# Patient Record
Sex: Male | Born: 1950 | Race: White | Hispanic: No | Marital: Single
Health system: Midwestern US, Academic
[De-identification: ages and names within clinical notes are randomized; demographics above are authoritative.]

---

## 2017-01-16 ENCOUNTER — Encounter: Admit: 2017-01-16 | Discharge: 2017-01-16 | Payer: Medicare Other

## 2017-01-16 DIAGNOSIS — F3340 Major depressive disorder, recurrent, in remission, unspecified: Principal | ICD-10-CM

## 2017-01-16 NOTE — Telephone Encounter
Last seen 12/01/16. Last filled 08/09/16 for sertraline and 12/20/13 for advair

## 2017-01-17 MED ORDER — SERTRALINE 50 MG PO TAB
ORAL_TABLET | Freq: Every day | 0 refills | Status: AC
Start: 2017-01-17 — End: 2017-02-14

## 2017-01-17 MED ORDER — FLUTICASONE-SALMETEROL 250-50 MCG/DOSE IN DSDV
1 | Freq: Two times a day (BID) | RESPIRATORY_TRACT | 1 refills | Status: AC
Start: 2017-01-17 — End: 2017-09-16

## 2017-01-20 ENCOUNTER — Encounter: Admit: 2017-01-20 | Discharge: 2017-01-20 | Payer: Medicare Other

## 2017-01-20 DIAGNOSIS — J410 Simple chronic bronchitis: Principal | ICD-10-CM

## 2017-01-20 NOTE — Telephone Encounter
He has been on these medications for over three years, [facility], see if he is still going to pulmonary.

## 2017-01-20 NOTE — Telephone Encounter
[facility] called with interaction between his HIV med with the Advair, saying the HIV med increases the advair side effects. . Do you want to change something, or go to lower Advair dose?

## 2017-01-21 NOTE — Telephone Encounter
L/m with pt to call me. L/m with pharm tech to let him know to call here too.

## 2017-02-14 ENCOUNTER — Encounter: Admit: 2017-02-14 | Discharge: 2017-02-14 | Payer: Medicare Other

## 2017-02-14 DIAGNOSIS — F3340 Major depressive disorder, recurrent, in remission, unspecified: Principal | ICD-10-CM

## 2017-02-14 MED ORDER — SERTRALINE 50 MG PO TAB
ORAL_TABLET | Freq: Every day | 0 refills | Status: AC
Start: 2017-02-14 — End: 2017-03-21

## 2017-02-14 NOTE — Telephone Encounter
Last Office visit 12/04/16

## 2017-03-21 ENCOUNTER — Encounter: Admit: 2017-03-21 | Discharge: 2017-03-21 | Payer: Medicare Other

## 2017-03-21 DIAGNOSIS — F3340 Major depressive disorder, recurrent, in remission, unspecified: Principal | ICD-10-CM

## 2017-03-21 MED ORDER — SERTRALINE 50 MG PO TAB
ORAL_TABLET | Freq: Every day | 0 refills | Status: AC
Start: 2017-03-21 — End: 2017-04-18

## 2017-03-21 NOTE — Telephone Encounter
Last Office visit 12/04/16

## 2017-04-18 ENCOUNTER — Encounter: Admit: 2017-04-18 | Discharge: 2017-04-18 | Payer: Medicare Other

## 2017-04-18 DIAGNOSIS — F3340 Major depressive disorder, recurrent, in remission, unspecified: Principal | ICD-10-CM

## 2017-04-18 MED ORDER — SERTRALINE 50 MG PO TAB
ORAL_TABLET | Freq: Every day | 0 refills | Status: AC
Start: 2017-04-18 — End: 2017-05-20

## 2017-04-18 NOTE — Telephone Encounter
Last Office visit 12/04/16

## 2017-05-19 ENCOUNTER — Encounter: Admit: 2017-05-19 | Discharge: 2017-05-19 | Payer: Medicare Other

## 2017-05-19 DIAGNOSIS — F3340 Major depressive disorder, recurrent, in remission, unspecified: Principal | ICD-10-CM

## 2017-05-20 MED ORDER — SERTRALINE 50 MG PO TAB
ORAL_TABLET | Freq: Every day | 0 refills | Status: AC
Start: 2017-05-20 — End: 2017-06-18

## 2017-05-20 NOTE — Telephone Encounter
Last Office visit 12/04/16  Pt has no upcoming apt scheduled at this time

## 2017-06-18 ENCOUNTER — Encounter: Admit: 2017-06-18 | Discharge: 2017-06-18 | Payer: Medicare Other

## 2017-06-18 DIAGNOSIS — F3340 Major depressive disorder, recurrent, in remission, unspecified: Principal | ICD-10-CM

## 2017-06-18 MED ORDER — SERTRALINE 50 MG PO TAB
ORAL_TABLET | Freq: Every day | 0 refills | Status: AC
Start: 2017-06-18 — End: 2017-07-18

## 2017-06-18 NOTE — Telephone Encounter
Last Office visit 12/04/16

## 2017-07-17 ENCOUNTER — Encounter: Admit: 2017-07-17 | Discharge: 2017-07-17 | Payer: Medicare Other

## 2017-07-17 DIAGNOSIS — F3340 Major depressive disorder, recurrent, in remission, unspecified: Principal | ICD-10-CM

## 2017-07-18 MED ORDER — SERTRALINE 50 MG PO TAB
ORAL_TABLET | Freq: Every day | 0 refills | Status: AC
Start: 2017-07-18 — End: 2017-08-19

## 2017-07-18 NOTE — Telephone Encounter
Last Office visit 12/04/16

## 2017-08-18 ENCOUNTER — Encounter: Admit: 2017-08-18 | Discharge: 2017-08-18 | Payer: Medicare Other

## 2017-08-18 DIAGNOSIS — F3340 Major depressive disorder, recurrent, in remission, unspecified: Principal | ICD-10-CM

## 2017-08-18 MED ORDER — SERTRALINE 50 MG PO TAB
ORAL_TABLET | Freq: Every day | 0 refills | Status: AC
Start: 2017-08-18 — End: 2017-08-19

## 2017-08-19 MED ORDER — SERTRALINE 50 MG PO TAB
100 mg | ORAL_TABLET | Freq: Every day | ORAL | 0 refills | Status: AC
Start: 2017-08-19 — End: 2017-09-16

## 2017-09-16 ENCOUNTER — Encounter: Admit: 2017-09-16 | Discharge: 2017-09-16 | Payer: Medicare Other

## 2017-09-16 DIAGNOSIS — F3340 Major depressive disorder, recurrent, in remission, unspecified: Principal | ICD-10-CM

## 2017-09-16 MED ORDER — ADVAIR DISKUS 250-50 MCG/DOSE IN DSDV
Freq: Two times a day (BID) | 1 refills | Status: AC
Start: 2017-09-16 — End: 2017-11-21

## 2017-09-16 MED ORDER — SERTRALINE 50 MG PO TAB
ORAL_TABLET | Freq: Every day | 0 refills | Status: AC
Start: 2017-09-16 — End: 2017-10-14

## 2017-10-14 ENCOUNTER — Encounter: Admit: 2017-10-14 | Discharge: 2017-10-14 | Payer: Medicare Other

## 2017-10-14 DIAGNOSIS — F3340 Major depressive disorder, recurrent, in remission, unspecified: Principal | ICD-10-CM

## 2017-10-14 MED ORDER — SERTRALINE 50 MG PO TAB
ORAL_TABLET | Freq: Every day | 0 refills | Status: AC
Start: 2017-10-14 — End: 2017-11-21

## 2017-11-04 ENCOUNTER — Encounter: Admit: 2017-11-04 | Discharge: 2017-11-04 | Payer: Medicare Other

## 2017-11-04 ENCOUNTER — Ambulatory Visit: Admit: 2017-11-04 | Discharge: 2017-11-05 | Payer: MEDICARE

## 2017-11-04 DIAGNOSIS — B20 Human immunodeficiency virus [HIV] disease: Principal | ICD-10-CM

## 2017-11-04 DIAGNOSIS — J410 Simple chronic bronchitis: ICD-10-CM

## 2017-11-04 DIAGNOSIS — G609 Hereditary and idiopathic neuropathy, unspecified: ICD-10-CM

## 2017-11-04 DIAGNOSIS — Z125 Encounter for screening for malignant neoplasm of prostate: ICD-10-CM

## 2017-11-04 DIAGNOSIS — I10 Essential (primary) hypertension: ICD-10-CM

## 2017-11-04 DIAGNOSIS — Z23 Encounter for immunization: ICD-10-CM

## 2017-11-04 DIAGNOSIS — F329 Major depressive disorder, single episode, unspecified: ICD-10-CM

## 2017-11-04 DIAGNOSIS — Z0000 Encounter for general adult medical examination without abnormal findings: Principal | ICD-10-CM

## 2017-11-04 DIAGNOSIS — F3340 Major depressive disorder, recurrent, in remission, unspecified: ICD-10-CM

## 2017-11-04 DIAGNOSIS — Z1329 Encounter for screening for other suspected endocrine disorder: ICD-10-CM

## 2017-11-04 DIAGNOSIS — R350 Frequency of micturition: ICD-10-CM

## 2017-11-04 DIAGNOSIS — Z136 Encounter for screening for cardiovascular disorders: ICD-10-CM

## 2017-11-05 ENCOUNTER — Encounter: Admit: 2017-11-05 | Discharge: 2017-11-05 | Payer: Medicare Other

## 2017-11-05 DIAGNOSIS — R5382 Chronic fatigue, unspecified: ICD-10-CM

## 2017-11-05 DIAGNOSIS — R718 Other abnormality of red blood cells: Principal | ICD-10-CM

## 2017-11-10 ENCOUNTER — Encounter: Admit: 2017-11-10 | Discharge: 2017-11-10 | Payer: Medicare Other

## 2017-11-10 DIAGNOSIS — R82998 Other abnormal findings in urine: Principal | ICD-10-CM

## 2017-11-11 LAB — LIPID PROFILE: Lab: 175 mg/dL (ref <200)

## 2017-11-11 LAB — CBC AND DIFF: Lab: 9.9 10*3/uL — ABNORMAL HIGH (ref <150)

## 2017-11-11 LAB — COMPREHENSIVE METABOLIC PANEL: Lab: 95 mg/dL (ref >40)

## 2017-11-11 LAB — PSA SCREEN

## 2017-11-11 LAB — VITAMIN B12: Lab: 495 pg/mL (ref 200–1100)

## 2017-11-11 LAB — URINALYSIS, MICROSCOPIC

## 2017-11-11 LAB — TSH WITH FREE T4 REFLEX: Lab: 2 m[IU]/L — ABNORMAL LOW (ref <=5)

## 2017-11-11 LAB — FOLATE, SERUM: Lab: 17 ng/mL

## 2017-11-17 ENCOUNTER — Encounter: Admit: 2017-11-17 | Discharge: 2017-11-17 | Payer: Medicare Other

## 2017-11-17 DIAGNOSIS — Z136 Encounter for screening for cardiovascular disorders: Principal | ICD-10-CM

## 2017-11-20 ENCOUNTER — Encounter: Admit: 2017-11-20 | Discharge: 2017-11-20 | Payer: Medicare Other

## 2017-11-20 DIAGNOSIS — F3340 Major depressive disorder, recurrent, in remission, unspecified: Principal | ICD-10-CM

## 2017-11-21 MED ORDER — SERTRALINE 50 MG PO TAB
ORAL_TABLET | Freq: Every day | 0 refills | Status: AC
Start: 2017-11-21 — End: 2017-12-22

## 2017-11-21 MED ORDER — WIXELA INHUB 250-50 MCG/DOSE IN DSDV
Freq: Two times a day (BID) | 3 refills | Status: AC
Start: 2017-11-21 — End: 2018-04-01

## 2017-12-22 ENCOUNTER — Encounter: Admit: 2017-12-22 | Discharge: 2017-12-22 | Payer: Medicare Other

## 2017-12-22 DIAGNOSIS — F3340 Major depressive disorder, recurrent, in remission, unspecified: Principal | ICD-10-CM

## 2017-12-22 MED ORDER — SERTRALINE 50 MG PO TAB
ORAL_TABLET | Freq: Every day | 0 refills | Status: AC
Start: 2017-12-22 — End: 2018-01-20

## 2018-01-09 ENCOUNTER — Encounter: Admit: 2018-01-09 | Discharge: 2018-01-09 | Payer: Medicare Other

## 2018-01-19 ENCOUNTER — Encounter: Admit: 2018-01-19 | Discharge: 2018-01-19 | Payer: Medicare Other

## 2018-01-19 DIAGNOSIS — F3340 Major depressive disorder, recurrent, in remission, unspecified: Principal | ICD-10-CM

## 2018-01-20 MED ORDER — SERTRALINE 50 MG PO TAB
ORAL_TABLET | Freq: Every day | 0 refills | Status: AC
Start: 2018-01-20 — End: 2018-02-27

## 2018-02-13 ENCOUNTER — Encounter: Admit: 2018-02-13 | Discharge: 2018-02-13 | Payer: Medicare Other

## 2018-02-27 ENCOUNTER — Encounter: Admit: 2018-02-27 | Discharge: 2018-02-27 | Payer: Medicare Other

## 2018-02-27 DIAGNOSIS — F3340 Major depressive disorder, recurrent, in remission, unspecified: Principal | ICD-10-CM

## 2018-02-27 MED ORDER — SERTRALINE 50 MG PO TAB
ORAL_TABLET | Freq: Every day | 0 refills | Status: AC
Start: 2018-02-27 — End: 2018-04-01

## 2018-04-01 ENCOUNTER — Encounter: Admit: 2018-04-01 | Discharge: 2018-04-01 | Payer: Medicare Other

## 2018-04-01 DIAGNOSIS — F3340 Major depressive disorder, recurrent, in remission, unspecified: Principal | ICD-10-CM

## 2018-04-01 MED ORDER — WIXELA INHUB 250-50 MCG/DOSE IN DSDV
Freq: Two times a day (BID) | 0 refills | Status: AC
Start: 2018-04-01 — End: 2018-06-04

## 2018-04-01 MED ORDER — SERTRALINE 50 MG PO TAB
ORAL_TABLET | Freq: Every day | 0 refills | Status: AC
Start: 2018-04-01 — End: 2018-11-04

## 2018-06-03 ENCOUNTER — Encounter: Admit: 2018-06-03 | Discharge: 2018-06-03 | Payer: Medicare Other

## 2018-06-04 MED ORDER — WIXELA INHUB 250-50 MCG/DOSE IN DSDV
Freq: Two times a day (BID) | 0 refills | Status: AC
Start: 2018-06-04 — End: 2019-01-06

## 2018-06-10 MED ORDER — ALBUTEROL SULFATE 90 MCG/ACTUATION IN HFAA
2 | RESPIRATORY_TRACT | 3 refills | Status: AC | PRN
Start: 2018-06-10 — End: 2019-01-29

## 2018-06-17 ENCOUNTER — Encounter: Admit: 2018-06-17 | Discharge: 2018-06-17 | Payer: Medicare Other

## 2018-06-19 ENCOUNTER — Encounter: Admit: 2018-06-19 | Discharge: 2018-06-19 | Payer: Medicare Other

## 2018-06-19 ENCOUNTER — Ambulatory Visit: Admit: 2018-06-19 | Discharge: 2018-06-20 | Payer: MEDICARE

## 2018-06-19 DIAGNOSIS — F329 Major depressive disorder, single episode, unspecified: ICD-10-CM

## 2018-06-19 DIAGNOSIS — I10 Essential (primary) hypertension: ICD-10-CM

## 2018-06-19 DIAGNOSIS — G609 Hereditary and idiopathic neuropathy, unspecified: ICD-10-CM

## 2018-06-19 DIAGNOSIS — B20 Human immunodeficiency virus [HIV] disease: Principal | ICD-10-CM

## 2018-06-19 MED ORDER — NICOTINE 21 MG/24 HR TD PT24
1 | MEDICATED_PATCH | TRANSDERMAL | 0 refills | Status: AC
Start: 2018-06-19 — End: ?

## 2018-06-20 DIAGNOSIS — R350 Frequency of micturition: Principal | ICD-10-CM

## 2018-06-20 DIAGNOSIS — Z23 Encounter for immunization: ICD-10-CM

## 2018-06-20 DIAGNOSIS — F3340 Major depressive disorder, recurrent, in remission, unspecified: Secondary | ICD-10-CM

## 2018-06-20 LAB — URINALYSIS, MICROSCOPIC

## 2018-06-21 LAB — CULTURE-URINE W/SENSITIVITY

## 2018-06-22 ENCOUNTER — Encounter: Admit: 2018-06-22 | Discharge: 2018-06-22 | Payer: Medicare Other

## 2018-06-25 ENCOUNTER — Encounter: Admit: 2018-06-25 | Discharge: 2018-06-25 | Payer: Medicare Other

## 2018-07-08 ENCOUNTER — Ambulatory Visit: Admit: 2018-07-08 | Discharge: 2018-07-09 | Payer: MEDICARE

## 2018-07-08 ENCOUNTER — Encounter: Admit: 2018-07-08 | Discharge: 2018-07-08 | Payer: Medicare Other

## 2018-07-08 DIAGNOSIS — B20 Human immunodeficiency virus [HIV] disease: Principal | ICD-10-CM

## 2018-07-08 DIAGNOSIS — G609 Hereditary and idiopathic neuropathy, unspecified: ICD-10-CM

## 2018-07-08 DIAGNOSIS — F329 Major depressive disorder, single episode, unspecified: ICD-10-CM

## 2018-07-08 DIAGNOSIS — I10 Essential (primary) hypertension: ICD-10-CM

## 2018-07-08 MED ORDER — DOXYCYCLINE MONOHYDRATE 100 MG PO TAB
100 mg | ORAL_TABLET | Freq: Two times a day (BID) | ORAL | 0 refills | 8.00000 days | Status: AC
Start: 2018-07-08 — End: 2019-01-12

## 2018-07-09 DIAGNOSIS — J069 Acute upper respiratory infection, unspecified: Principal | ICD-10-CM

## 2018-11-04 ENCOUNTER — Encounter: Admit: 2018-11-04 | Discharge: 2018-11-04 | Payer: Medicare Other

## 2018-11-04 DIAGNOSIS — F3340 Major depressive disorder, recurrent, in remission, unspecified: Principal | ICD-10-CM

## 2018-11-04 MED ORDER — SERTRALINE 50 MG PO TAB
ORAL_TABLET | Freq: Every day | 0 refills | Status: AC
Start: 2018-11-04 — End: 2018-11-30

## 2018-11-30 ENCOUNTER — Encounter: Admit: 2018-11-30 | Discharge: 2018-11-30 | Payer: Medicare Other

## 2018-11-30 DIAGNOSIS — F3340 Major depressive disorder, recurrent, in remission, unspecified: Principal | ICD-10-CM

## 2018-11-30 MED ORDER — SERTRALINE 50 MG PO TAB
ORAL_TABLET | Freq: Every day | 0 refills | Status: DC
Start: 2018-11-30 — End: 2018-12-28

## 2018-12-15 ENCOUNTER — Encounter: Admit: 2018-12-15 | Discharge: 2018-12-15 | Payer: Medicare Other

## 2018-12-22 ENCOUNTER — Encounter: Admit: 2018-12-22 | Discharge: 2018-12-22 | Payer: Medicare Other

## 2018-12-22 NOTE — Telephone Encounter
Patient called with concerns related to COVID.    Patient experiencing any life-threatening symptoms? No  Extreme difficulty breathing, blue lips/face, severe/constant pain or pressure in the chest, altered mental status, slurred speech, seizure, coughing up blood, too weak to stand, etc.  - If yes, 911 or ED recommended.     Known or suspected contact with COVID positive case? No    Patient in long term care, skilled nursing, etc.? No    COVID symptoms? Yes, Fever, Shortness of breath (mild), Fatigue, Anorexia (decreased hunger) and Headache. Symptoms started on 12/15/2018.    Please select any risk factors: Patient 60+    Assessment: COVID suspected.    Plan: Transferred patient to scheduling line to schedule swab.    If patient is a suspected case, please direct them to their county health department to self-report symptoms.  [facility]: [email or web address]  [facility]: [email or web address]     Testing Guidelines    CDC Testing Guidelines    Script for suspected cases, NOT getting tested:  Sorry you're feeling ill. You have one or more symptom(s) that may be related to COVID-19. Start home isolation. This means staying home except to get medical care. Do not go to work, school, or public areas. Do not use public transportation or ride sharing. Follow good hand hygiene and increase fluids and rest. Please call back or seek care if you feel worse or you think it is an emergency. Call ahead before going to any ED or clinic.   You can stop isolation if:  1. You have had no fever for at least 72 hours (that is three full days of no fever without the use medicine that reduces fevers)  AND  2. Other symptoms have improved (for example, when your cough or shortness of breath have improved)  AND   3. At least 7 days have passed since your symptoms first appeared

## 2018-12-28 ENCOUNTER — Encounter: Admit: 2018-12-28 | Discharge: 2018-12-28 | Payer: Medicare Other

## 2018-12-28 DIAGNOSIS — F3340 Major depressive disorder, recurrent, in remission, unspecified: Principal | ICD-10-CM

## 2018-12-28 MED ORDER — SERTRALINE 50 MG PO TAB
ORAL_TABLET | Freq: Every day | 0 refills | Status: DC
Start: 2018-12-28 — End: 2019-01-29

## 2019-01-05 ENCOUNTER — Encounter: Admit: 2019-01-05 | Discharge: 2019-01-05 | Payer: Medicare Other

## 2019-01-06 MED ORDER — ADVAIR DISKUS 250-50 MCG/DOSE IN DSDV
Freq: Two times a day (BID) | 1 refills | Status: DC
Start: 2019-01-06 — End: 2019-03-02

## 2019-01-11 ENCOUNTER — Encounter: Admit: 2019-01-11 | Discharge: 2019-01-11

## 2019-01-11 NOTE — Telephone Encounter
Patient left message on triage line requesting follow up appointment after ED visit.    Called patient back and left message to return call.

## 2019-01-12 ENCOUNTER — Encounter: Admit: 2019-01-12 | Discharge: 2019-01-12

## 2019-01-12 DIAGNOSIS — J410 Simple chronic bronchitis: Secondary | ICD-10-CM

## 2019-01-12 DIAGNOSIS — I10 Essential (primary) hypertension: Secondary | ICD-10-CM

## 2019-01-12 DIAGNOSIS — F329 Major depressive disorder, single episode, unspecified: Secondary | ICD-10-CM

## 2019-01-12 DIAGNOSIS — B20 Human immunodeficiency virus [HIV] disease: Secondary | ICD-10-CM

## 2019-01-12 DIAGNOSIS — G609 Hereditary and idiopathic neuropathy, unspecified: Secondary | ICD-10-CM

## 2019-01-12 DIAGNOSIS — F17210 Nicotine dependence, cigarettes, uncomplicated: Secondary | ICD-10-CM

## 2019-01-12 NOTE — Patient Instructions
Smoking Cessation Patient Educational resources:   Dear patient,   As your healthcare team, we want you to take steps to cut down or quit smoking. Please review the following educational materials and resources:  Resources for Patients to Quit Smoking  If you live outside of [place] or [place]  National Quit line: 1-800-QUIT-NOW (1-800-784-8669) This line will route you to your state sponsored quit line based on your calling area.  The website for the national quit line is: www.smokefree.gov  For [place] residents:   [place] Dept of Health & Environment KanQuit program  (1-800-QUIT-NOW or 1-800-784-8669)  http://www.kdheks.gov/tobacco/cessation.html  Free program 24hrs per day 7 days per week. English, Spanish, & 150 other languages.  [place] Quit line: 1-866-KAN-STOP (1-866-526-7867)  http://www.kstask.org/quitline.html  Free program available 24 hrs per day 7 days per week. Information is available via phone or online. Phone assessment by trained counselor to help identify triggers and set goals, smoker can call as many times as desired.  For [place] residents  [place] quit line website:  http://health.mo.gov/living/wellness/tobacco/smokingandtobacco/  link directly to the [place] quit [place] patient brochure:  http://health.mo.gov/living/wellness/tobacco/smokingandtobacco/pdf/QuitlineFactSheet2.pdf  ==================================================================

## 2019-01-12 NOTE — Telephone Encounter
ED Discharge Follow Up  Reached patient: Seen in clinic 24-48 hours post discharge  Admission Information  Hospital Name : [facility]   ED Admission Date: 01/10/19 ED Discharge Date: 01/10/19 Admission Diagnosis: Shortness of breath / cough  Discharge Diagnosis COPD exacerbation (HCC) (Primary Dx)  Hospital Services: Unplanned  Today's call is 2 (calendar) days post discharge    Scheduling Follow-up Appointment  Upcoming appointment date and time and with whom scheduled:   Future Appointments   Date Time Provider Department Center   03/01/2019  2:30 PM Primus Bravo, DO [facility]     When was patients last PCP visit: 01/12/2019  PCP primary location: [facility]  PCP appointment scheduled? Yes, Date: seen 01/12/2019   Specialist appointment scheduled? No  Both PCP and Specialist appointment scheduled: No  Is assistance with transportation needed? No    ED Communication    Did Pt call Clinic prior to going to ED? No      Lavell Islam, [place]

## 2019-01-12 NOTE — Progress Notes
Date of Service: 01/12/2019    Anthony Whitehead is a 68 y.o. male.  DOB: June 06, 1951  MRN: [id number]     Subjective:             History of Present Illness   This patient is here today stating that he was in the emergency room on Sunday at [facility] for cough and shortness of breath.  He did have a negative COVID-19 test performed.  He was given a Z-Pak and prednisone and is doing much better at this point.  However the patient continues to smoke cigarettes although he is trying to decrease his amount and we discussed different strategies to quit smoking.  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         Review of Systems   Constitutional: Negative.    HENT: Negative.    Eyes: Negative.    Respiratory: Positive for cough.    Cardiovascular: Negative.    Gastrointestinal: Negative.    Endocrine: Negative.    Genitourinary: Negative.    Musculoskeletal: Negative.    Skin: Negative.    Allergic/Immunologic: Negative.    Neurological: Negative.    Hematological: Negative.    Psychiatric/Behavioral: Negative.          Objective:         ??? ADVAIR DISKUS 250-50 mcg/dose inhalation disk INHALE 1 PUFF BY MOUTH EVERY 12 HOURS   ??? albuterol (PROAIR HFA, VENTOLIN HFA, PROVENTIL HFA) 90 mcg/actuation inhaler Inhale two puffs by mouth into the lungs every 6 hours as needed.   ??? azithromycin (ZITHROMAX) 250 mg tablet Take 2 tablets (500 mg total) on day 1.  Take 1 tablet (250 mg total) by mouth daily from day 2-4.   ??? bictegravir-emtricitabine-tenofovir ala (BIKTARVY) 50-200-25 mg tablet Take 1 tablet by mouth daily.   ??? cyanocobalamin(+) (VITAMIN B-12) 100 mcg tablet Take 100 mcg by mouth daily.   ??? doxycycline (ADOXA) 100 mg tablet Take one tablet by mouth twice daily.   ??? MULTIVITAMIN PO Take  by mouth.     ??? nicotine (NICODERM CQ STEP 1) 21 mg/day patch Apply one patch to top of skin as directed every 24 hours. Rotate patch location.  Indications: Stop Smoking   ??? predniSONE (DELTASONE) 20 mg tablet Take 40 mg by mouth daily.   ??? sertraline (ZOLOFT) 50 mg tablet TAKE 2 TABLETS BY MOUTH DAILY     Vitals:    01/12/19 1334   BP: (!) 140/70   BP Source: Arm, Left Upper   Patient Position: Sitting   Pulse: 112   Resp: 16   Temp: 36.5 ???C (97.7 ???F)   TempSrc: Oral   SpO2: 97%   Weight: 70.8 kg (156 lb)   Height: 185.4 cm (73)   PainSc: Zero     Body mass index is 20.58 kg/m???.     Physical Exam  Constitutional:       Appearance: He is well-developed.   HENT:      Head: Normocephalic.      Right Ear: External ear normal.      Left Ear: External ear normal.   Eyes:      Pupils: Pupils are equal, round, and reactive to light.   Neck:      Musculoskeletal: Normal range of motion and neck supple.   Cardiovascular:      Rate and  Rhythm: Normal rate and regular rhythm.      Heart sounds: Normal heart sounds.   Pulmonary:      Effort: Pulmonary effort is normal.      Breath sounds: Normal breath sounds.   Abdominal:      General: Bowel sounds are normal.      Palpations: Abdomen is soft.   Musculoskeletal: Normal range of motion.   Skin:     General: Skin is warm and dry.   Neurological:      Mental Status: He is alert and oriented to person, place, and time.      Deep Tendon Reflexes: Reflexes are normal and symmetric.              Assessment and Plan:  Anthony Burget. Whitehead was seen today for follow up.    Diagnoses and all orders for this visit:    Simple chronic bronchitis (HCC)    HIV infection, unspecified symptom status (HCC)      Smoking Cessation Management & Counseling:  Social History     Tobacco Use   ??? Smoking status: Current Every Day Smoker     Packs/day: 0.50     Start date: 11/12/2018   ??? Smokeless tobacco: Never Used   Substance Use Topics   ??? Alcohol use: Yes     Frequency: Monthly or less     Drinks per session: 1 or 2     Binge frequency: Never During today's visit, I counseled the patient on smoking cessation.  This was through face-to-face patient contact.  We discussed how tobacco use can adversely affect the patient's medical condition. We also discussed strategies to quit smoking including the use of medications and counseling.  The patient is planning to try to quit or cut down on smoking.   Impression: Tobacco use (smoking)  Plan:  The following are brief descriptors of advice discussed at today's visit:   Practical Counseling Advice (problem -solving/skills training)  ??? Make the house smoke free                  ??? Identify alternatives when faced with the urge to smoke       .     Identified reasons to quit   Recommended patient consider speaking to a cessation counselor over the telephone or signing up for a text-message cessation program. The patient chose:free smoking cessation quitline (1-800-QUIT-NOW 917-217-0590).  Discussed medication options (covered by most insurance plans). The patient chose:No medications at this time.  Time spent during clinic visit with patient discussing and counseling regarding smoking cessation is:3-10 minutes.  Smoking Cessation Patient Educational resources:   Dear patient,   As your healthcare team, we want you to take steps to cut down or quit smoking. Please review the following educational materials and resources:  Resources for Patients to Quit Smoking  If you live outside of [place] or [place] Quit line: 1-800-QUIT-NOW 7044583499) This line will route you to your state sponsored quit line based on your calling area.  The website for the national quit line is: www.smokefree.gov  For [place] residents:   [place] Dept of Health & Environment KanQuit program  (952)876-5249 or (509) 708-3245)  ConnectRV.is.html  Free program 24hrs per day 7 days per week. English, [place], & 150 other languages.  [place] Quit line: 1-866-KAN-STOP (3-244-010-2725) http://www.kstask.org/quitline.html  Free program available 24 hrs per day 7 days per week. Information is available via phone or online. Phone assessment by trained counselor to help identify  triggers and set goals, smoker can call as many times as desired.  For [place] residents  [place] website:  ThisMLS.nl  link directly to the [place] quit [place] patient brochure:  TVFolio.ch.pdf  ==================================================================

## 2019-01-13 ENCOUNTER — Ambulatory Visit: Admit: 2019-01-12 | Discharge: 2019-01-13

## 2019-01-13 DIAGNOSIS — B20 Human immunodeficiency virus [HIV] disease: Secondary | ICD-10-CM

## 2019-01-19 ENCOUNTER — Encounter: Admit: 2019-01-19 | Discharge: 2019-01-19

## 2019-01-19 NOTE — Telephone Encounter
OK 

## 2019-01-19 NOTE — Telephone Encounter
Letter has been written and signed. Placed written in folder up front.   Called and lm for pt.

## 2019-01-19 NOTE — Telephone Encounter
Pt left a message stating he was told by Dr. Vincent Peyer that if he needed more time off work to call. Pt is asking for his time to be extended to this coming Monday 01/25/2019, if Dr. Vincent Peyer will allow it.

## 2019-01-20 ENCOUNTER — Encounter: Admit: 2019-01-20 | Discharge: 2019-01-20

## 2019-01-20 NOTE — Telephone Encounter
[facility] received a voicemail form pt requesting a counseling session. [facility] called and left pt a voicemail with a call back number and instructions for leaving a message.

## 2019-01-20 NOTE — Telephone Encounter
Called and informed pt.

## 2019-01-22 ENCOUNTER — Encounter: Admit: 2019-01-22 | Discharge: 2019-01-22

## 2019-01-22 NOTE — Telephone Encounter
[facility] received a call back from pt. Pt scheduled with [facility] for Monday at 1:00pm the 15th.

## 2019-01-25 ENCOUNTER — Encounter: Admit: 2019-01-25 | Discharge: 2019-01-25

## 2019-01-25 NOTE — Telephone Encounter
[facility] called pt as scheduled for telephone clinical support. Pt did not answer, [place] left a voicemail and a call back number.

## 2019-01-28 ENCOUNTER — Encounter: Admit: 2019-01-28 | Discharge: 2019-01-28

## 2019-01-28 DIAGNOSIS — F3340 Major depressive disorder, recurrent, in remission, unspecified: Secondary | ICD-10-CM

## 2019-01-29 ENCOUNTER — Encounter: Admit: 2019-01-29 | Discharge: 2019-01-29

## 2019-01-29 MED ORDER — SERTRALINE 50 MG PO TAB
ORAL_TABLET | Freq: Every day | 0 refills | Status: DC
Start: 2019-01-29 — End: 2019-03-02

## 2019-01-29 MED ORDER — ALBUTEROL SULFATE 90 MCG/ACTUATION IN HFAA
Freq: Four times a day (QID) | 1 refills | Status: DC | PRN
Start: 2019-01-29 — End: 2019-03-02

## 2019-01-29 MED ORDER — PROAIR HFA 90 MCG/ACTUATION IN HFAA
Freq: Four times a day (QID) | 1 refills | Status: DC | PRN
Start: 2019-01-29 — End: 2019-01-29

## 2019-01-29 NOTE — Telephone Encounter
Last seen 01/12/2019. Last filled 12/28/2018 for Sertraline and 06/10/18 for Albuterol

## 2019-01-29 NOTE — Telephone Encounter
Last Office visit 01/12/19  Future Appointments   Date Time Provider [place]   03/01/2019  2:30 PM Drew

## 2019-02-17 ENCOUNTER — Encounter: Admit: 2019-02-17 | Discharge: 2019-02-17

## 2019-02-17 DIAGNOSIS — R05 Cough: Secondary | ICD-10-CM

## 2019-02-17 DIAGNOSIS — R0602 Shortness of breath: Secondary | ICD-10-CM

## 2019-02-17 NOTE — Telephone Encounter
Sx SOA and coughing,  Fatigue. Loss of taste.  started a while back and worsened last couple days.  He would like to get a lab order faxed to lab at [facility]. Pt is asking if you will order a COVID?    [phone] ph  Fax ([phone] on website.     Not sure if this is right it is an Urgency Room. Pt said the HD gave him the above. But they did not seem familiar with this process. And they hung up on me. Pt notified and he may try to get the fax too.

## 2019-02-17 NOTE — Telephone Encounter
Pt l/m to get covid test. But did not say he has symptoms or if he has been exposed. R/c and l/m to call     He wants this sent to [facility].

## 2019-02-17 NOTE — Telephone Encounter
ok 

## 2019-03-01 ENCOUNTER — Encounter: Admit: 2019-03-01 | Discharge: 2019-03-01

## 2019-03-01 DIAGNOSIS — F3340 Major depressive disorder, recurrent, in remission, unspecified: Secondary | ICD-10-CM

## 2019-03-01 DIAGNOSIS — F329 Major depressive disorder, single episode, unspecified: Secondary | ICD-10-CM

## 2019-03-01 DIAGNOSIS — B20 Human immunodeficiency virus [HIV] disease: Secondary | ICD-10-CM

## 2019-03-01 DIAGNOSIS — I10 Essential (primary) hypertension: Secondary | ICD-10-CM

## 2019-03-01 DIAGNOSIS — G609 Hereditary and idiopathic neuropathy, unspecified: Secondary | ICD-10-CM

## 2019-03-01 NOTE — Telephone Encounter
Last seen 01/12/2019  Has appt 03/01/2019

## 2019-03-02 MED ORDER — ADVAIR DISKUS 250-50 MCG/DOSE IN DSDV
Freq: Two times a day (BID) | 1 refills | Status: DC
Start: 2019-03-02 — End: 2019-05-04

## 2019-03-02 MED ORDER — SERTRALINE 50 MG PO TAB
ORAL_TABLET | Freq: Every day | 0 refills | Status: DC
Start: 2019-03-02 — End: 2019-03-29

## 2019-03-02 MED ORDER — ALBUTEROL SULFATE 90 MCG/ACTUATION IN HFAA
Freq: Four times a day (QID) | 1 refills | Status: DC | PRN
Start: 2019-03-02 — End: 2019-06-29

## 2019-03-03 ENCOUNTER — Encounter: Admit: 2019-03-03 | Discharge: 2019-03-03

## 2019-03-03 NOTE — Telephone Encounter
Pt L/m to get labs sent to [facility]. R/c and l/m I did

## 2019-03-04 ENCOUNTER — Encounter: Admit: 2019-03-04 | Discharge: 2019-03-04

## 2019-03-08 ENCOUNTER — Encounter: Admit: 2019-03-08 | Discharge: 2019-03-08

## 2019-03-08 NOTE — Telephone Encounter
covid test was done at a drive through. He will get them to fax the result. He will call me if I don't call him. He will need the result to RT[facility].Marland KitchenMarland Kitchen

## 2019-03-08 NOTE — Telephone Encounter
Pt wants the covid test and had me fax the test result. I faxed and notified pt

## 2019-03-09 ENCOUNTER — Encounter: Admit: 2019-03-09 | Discharge: 2019-03-09

## 2019-03-09 NOTE — Progress Notes
Anthony Whitehead is a 68 y.o.  male seen for an integrative behavioral health visit.    Visit Performed:Telephone    Referring Physician: [place], Kathaleen Bury, DO    [facility] Provider name: Sharen Hint, LCSW    Focus of Visit: Mental Health  Anxiety     Additional Comments: Pt stated he is not sure where to start but that he has been isolating more and has more worry than normal. Pt believes it began with the pandemic. [facility] reflectively listened and asked open-ended questions for clarification. Pt is having a hard time sleeping and concentrating. Pt has not work since the start of the pandemic and is getting ready to go back to work once cleared medically. [facility] presented pt with mindfulness approach and diaphragmatic breathing. Pt will work on this for the week and follow-up with [facility] next week.     Specific Intervention: Coping Skills Training Brief CBT and Relaxation, mindfulness, Motivational Interviewing , Psychoeducation, Self-monitoring and Supportive Counseling    Plan/Recommendation: Continued Integrated Behavioral Treatment    Patient goals: Reduce anxiety.    Length of visit: 25 minutes

## 2019-03-10 ENCOUNTER — Encounter: Admit: 2019-03-10 | Discharge: 2019-03-10

## 2019-03-10 NOTE — Telephone Encounter
Pt said he got COVID test and the [facility] is faxing the result so we can send it to his work. I let him know I did not receive it. I tried calling to that clinic and was eventually hung up on after 10 min hold. I l/m with their covid line to call fax or email this to [place]. We have already faxed for the report via our cover sheet     Notifying pt of this. He said he had similar experience by trying to call. He finally

## 2019-03-10 NOTE — Telephone Encounter
Notified pt is still have not received lab from his swab for COVID. When I do get it, he is asking that I email the result to: [email or web address]

## 2019-03-11 NOTE — Telephone Encounter
Pt lm on vm requesting status of fax from other office, where COVID -19 had been done.   Lm on vm that we have not received any lab results for covid -19 testing, I have lm with [facility] to send to our office, confirming Fax number for them.

## 2019-03-12 ENCOUNTER — Encounter: Admit: 2019-03-12 | Discharge: 2019-03-12

## 2019-03-12 NOTE — Telephone Encounter
Pt had lm on vm following up on testing results.   We recvd them, spoke to patient he would like to return to work on august 3rd,2020  Pt request letters to be sent to [email or web address]   Sending letter to provider for review and signature

## 2019-03-15 ENCOUNTER — Encounter: Admit: 2019-03-15 | Discharge: 2019-03-15

## 2019-03-17 NOTE — Progress Notes
Anthony Whitehead is a 68 y.o.  male seen for a follow up behavioral health visit.    Visit Performed:Telephone    BH Provider name: Sharen Hint, LCSW    Focus of Visit: Mental Health  Anxiety     Session Content: Pt had an anxious mood and congruent affect. Pt stated he has been doing the breathing, and did read over the material sent from [facility]. Pt is starting back to work and stated that is an area of concern because they are letting people go pt's considered as necessary, and he is worried about his own job. [facility] reflectively listened and provided support.     Specific Intervention:Coping Skills Training Brief CBT and Relaxation, mindfulness, Motivational Interviewing  and Supportive Counseling      Plan/Recommendation: Continued Integrated Behavioral Treatment as needed.         Patient goals/Progress toward: Reduce anxiety.     Length of visit: 25 minutes

## 2019-03-19 ENCOUNTER — Encounter: Admit: 2019-03-19 | Discharge: 2019-03-19

## 2019-03-19 NOTE — Telephone Encounter
[facility] called pt back to reschedule his appt. [facility] left a voicemail with a call back number.

## 2019-03-29 ENCOUNTER — Encounter: Admit: 2019-03-29 | Discharge: 2019-03-29 | Payer: Medicare Other

## 2019-03-29 DIAGNOSIS — F3340 Major depressive disorder, recurrent, in remission, unspecified: Secondary | ICD-10-CM

## 2019-03-29 MED ORDER — SERTRALINE 50 MG PO TAB
ORAL_TABLET | Freq: Every day | 0 refills | Status: DC
Start: 2019-03-29 — End: 2019-05-04

## 2019-03-29 NOTE — Telephone Encounter
Last seen 01/12/2019  Last filled 03/02/2019

## 2019-04-15 ENCOUNTER — Encounter: Admit: 2019-04-15 | Discharge: 2019-04-15

## 2019-04-15 DIAGNOSIS — Z1159 Encounter for screening for other viral diseases: Secondary | ICD-10-CM

## 2019-04-15 NOTE — Telephone Encounter
Pt has been coughing and having SOA. He does not know of contact with COVID. But due to his very ill mother, he needs to have a test

## 2019-04-15 NOTE — Telephone Encounter
Pt l/m. R/c and l/m.     He has sx similar to COVID that he cannot work till cleared.

## 2019-04-17 ENCOUNTER — Encounter: Admit: 2019-04-17 | Discharge: 2019-04-17

## 2019-04-17 NOTE — Progress Notes
Patient arrived to [facility] for COVID-19 testing 04/17/19 1019 enter time collected}. Patient identity confirmed via photo I.D. Nasopharyngeal procedure explained to the patient.   Nasopharyngeal swab completed left  Patient education provided given and instructed patient self isolate until contacted w/ results and further instructions. CDC handout on COVID-19 given to patient.   [email or web address]    Swab collected by J CLEMENS, MA.    Date symptoms began/reason for testing: URI

## 2019-04-18 ENCOUNTER — Encounter: Admit: 2019-04-17 | Discharge: 2019-04-18

## 2019-04-18 DIAGNOSIS — Z1159 Encounter for screening for other viral diseases: Principal | ICD-10-CM

## 2019-04-18 LAB — COVID-19 (SARS-COV-2) PCR

## 2019-04-18 NOTE — Progress Notes
Called patient and verified full name and date of birth. Informed patient of NEGATIVE COVID 19 testing. Patient was asked if they were still experiencing any symptoms and responded that he still some symptoms. Patient was advised to contact PCP, specialist, and/or ordering provider for ongoing symptoms. Patient had no further questions.

## 2019-04-18 NOTE — Progress Notes
Attempted to call patient to notify of negative COVID-19 test result. LVM.      Bard Herbert, Therapist, sports, BSN

## 2019-04-23 ENCOUNTER — Encounter: Admit: 2019-04-23 | Discharge: 2019-04-23

## 2019-04-23 NOTE — Telephone Encounter
Pt c/o trouble breathing and chest congestion x 3-4 weeks. Has COPD. Using inhalers. Somewhat help. No fever. Had neg covid 9/5. Advised urgent care to be evaluated. May have COPD flare. Pt understands and will go be evaluated.

## 2019-05-04 ENCOUNTER — Encounter: Admit: 2019-05-04 | Discharge: 2019-05-04 | Payer: Medicare Other

## 2019-05-04 MED ORDER — SERTRALINE 50 MG PO TAB
ORAL_TABLET | Freq: Every day | 0 refills | Status: DC
Start: 2019-05-04 — End: 2019-05-31

## 2019-05-04 MED ORDER — ADVAIR DISKUS 250-50 MCG/DOSE IN DSDV
Freq: Two times a day (BID) | 1 refills | Status: DC
Start: 2019-05-04 — End: 2019-06-29

## 2019-05-04 NOTE — Telephone Encounter
Last seen 03/01/2019  Last filled 03/29/2019

## 2019-05-31 ENCOUNTER — Encounter: Admit: 2019-05-31 | Discharge: 2019-05-31 | Payer: Medicare Other

## 2019-05-31 DIAGNOSIS — F3340 Major depressive disorder, recurrent, in remission, unspecified: Secondary | ICD-10-CM

## 2019-05-31 MED ORDER — SERTRALINE 50 MG PO TAB
ORAL_TABLET | Freq: Every day | 0 refills | Status: DC
Start: 2019-05-31 — End: 2019-06-29

## 2019-05-31 NOTE — Telephone Encounter
Last seen 03/01/2019  Last filled 05/04/2019

## 2019-06-29 ENCOUNTER — Encounter: Admit: 2019-06-29 | Discharge: 2019-06-29 | Payer: Medicare Other

## 2019-06-29 DIAGNOSIS — F3340 Major depressive disorder, recurrent, in remission, unspecified: Secondary | ICD-10-CM

## 2019-06-29 MED ORDER — SERTRALINE 50 MG PO TAB
ORAL_TABLET | Freq: Every day | 0 refills | Status: DC
Start: 2019-06-29 — End: 2019-07-27

## 2019-06-29 MED ORDER — ALBUTEROL SULFATE 90 MCG/ACTUATION IN HFAA
Freq: Four times a day (QID) | 0 refills | Status: DC | PRN
Start: 2019-06-29 — End: 2019-07-30

## 2019-06-29 MED ORDER — ADVAIR DISKUS 250-50 MCG/DOSE IN DSDV
Freq: Two times a day (BID) | 0 refills | Status: DC
Start: 2019-06-29 — End: 2019-07-27

## 2019-06-29 NOTE — Telephone Encounter
Last seen 03/01/2019  Last filled 05/31/2019

## 2019-07-27 ENCOUNTER — Encounter: Admit: 2019-07-27 | Discharge: 2019-07-27 | Payer: Medicare Other

## 2019-07-27 DIAGNOSIS — F3340 Major depressive disorder, recurrent, in remission, unspecified: Secondary | ICD-10-CM

## 2019-07-27 MED ORDER — SERTRALINE 50 MG PO TAB
100 mg | ORAL_TABLET | Freq: Every day | ORAL | 0 refills | Status: DC
Start: 2019-07-27 — End: 2019-08-10

## 2019-07-27 MED ORDER — ADVAIR DISKUS 250-50 MCG/DOSE IN DSDV
1 | Freq: Two times a day (BID) | RESPIRATORY_TRACT | 0 refills | Status: DC
Start: 2019-07-27 — End: 2019-08-10

## 2019-07-27 NOTE — Telephone Encounter
Last seen 03/01/2019  Last filled 06/29/2019

## 2019-07-29 ENCOUNTER — Encounter: Admit: 2019-07-29 | Discharge: 2019-07-29 | Payer: Medicare Other

## 2019-07-29 NOTE — Telephone Encounter
Last seen 03/01/2019. Last filled 06/29/2019

## 2019-07-30 MED ORDER — ALBUTEROL SULFATE 90 MCG/ACTUATION IN HFAA
2 | RESPIRATORY_TRACT | 0 refills | Status: DC | PRN
Start: 2019-07-30 — End: 2019-08-10

## 2019-08-09 ENCOUNTER — Encounter: Admit: 2019-08-09 | Discharge: 2019-08-09 | Payer: Medicare Other

## 2019-08-09 NOTE — Telephone Encounter
Pt said his walker is 22+ YEARS OLD. He is ready for a new one. Pt is coming in tomorrow to discuss.

## 2019-08-10 ENCOUNTER — Encounter: Admit: 2019-08-10 | Discharge: 2019-08-10 | Payer: Medicare Other

## 2019-08-10 ENCOUNTER — Ambulatory Visit: Admit: 2019-08-10 | Discharge: 2019-08-10 | Payer: Medicare Other

## 2019-08-10 DIAGNOSIS — M160 Bilateral primary osteoarthritis of hip: Secondary | ICD-10-CM

## 2019-08-10 DIAGNOSIS — B20 Human immunodeficiency virus [HIV] disease: Secondary | ICD-10-CM

## 2019-08-10 DIAGNOSIS — F329 Major depressive disorder, single episode, unspecified: Secondary | ICD-10-CM

## 2019-08-10 DIAGNOSIS — Z7409 Other reduced mobility: Secondary | ICD-10-CM

## 2019-08-10 DIAGNOSIS — I10 Essential (primary) hypertension: Secondary | ICD-10-CM

## 2019-08-10 DIAGNOSIS — F3340 Major depressive disorder, recurrent, in remission, unspecified: Secondary | ICD-10-CM

## 2019-08-10 DIAGNOSIS — G609 Hereditary and idiopathic neuropathy, unspecified: Secondary | ICD-10-CM

## 2019-08-10 MED ORDER — ALBUTEROL SULFATE 90 MCG/ACTUATION IN HFAA
2 | RESPIRATORY_TRACT | 3 refills | Status: DC | PRN
Start: 2019-08-10 — End: 2019-10-22

## 2019-08-10 MED ORDER — SERTRALINE 50 MG PO TAB
100 mg | ORAL_TABLET | Freq: Every day | ORAL | 3 refills | Status: DC
Start: 2019-08-10 — End: 2019-12-14

## 2019-08-10 MED ORDER — ADVAIR DISKUS 250-50 MCG/DOSE IN DSDV
1 | Freq: Two times a day (BID) | RESPIRATORY_TRACT | 3 refills | Status: DC
Start: 2019-08-10 — End: 2019-12-14

## 2019-08-10 NOTE — Progress Notes
Date of Service: 08/10/2019    Anthony Whitehead is a 68 y.o. male.  DOB: 17-May-1951  MRN: [id number]     Subjective:             History of Present Illness  Obtained patient's, or patient proxy's, verbal consent to treat them and their agreement to [facility] financial policy and NPP via this telehealth visit during the [facility] Emergency    This visit was conducted today via telephone at the patient's request to the recent coronavirus public health emergency.  His blood pressure has been doing well on his current medications we will continue him on that for now.  He does have a lot of problems walking due to his bilateral hip pain and his mobility is severely impaired, he states he would need a wheelchair in order to get to his doctor visits and also would help with getting around the house.         Review of Systems   Constitutional: Negative.    HENT: Negative.    Eyes: Negative.    Respiratory: Negative.    Cardiovascular: Negative.    Gastrointestinal: Negative.    Endocrine: Negative.    Genitourinary: Negative.    Musculoskeletal: Negative.    Skin: Negative.    Allergic/Immunologic: Negative.    Neurological: Negative.    Hematological: Negative.    Psychiatric/Behavioral: Negative.          Objective:         ? ADVAIR DISKUS 250-50 mcg/dose inhalation disk Inhale one puff by mouth into the lungs twice daily.   ? albuterol sulfate (PROAIR HFA) 90 mcg/actuation HFA aerosol inhaler Inhale two puffs by mouth into the lungs every 6 hours as needed for Wheezing or Shortness of Breath. Shake well before use.   ? bictegravir-emtricitabine-tenofovir ala (BIKTARVY) 50-200-25 mg tablet Take 1 tablet by mouth daily.   ? cyanocobalamin(+) (VITAMIN B-12) 100 mcg tablet Take 100 mcg by mouth daily.   ? MULTIVITAMIN PO Take  by mouth.     ? nicotine (NICODERM CQ STEP 1) 21 mg/day patch Apply one patch to top of skin as directed every 24 hours. Rotate patch location.  Indications: Stop Smoking ? sertraline (ZOLOFT) 50 mg tablet Take two tablets by mouth daily.     There were no vitals filed for this visit.  There is no height or weight on file to calculate BMI.     Physical Exam  Pulmonary:      Effort: Pulmonary effort is normal.      Breath sounds: Normal breath sounds.   Neurological:      Mental Status: He is alert.   Psychiatric:         Mood and Affect: Mood normal.         Behavior: Behavior normal.         Thought Content: Thought content normal.              Assessment and Plan:  Hypertension Management:  Medication adherent: most of the time    Treatment goal: 140/90  Outside blood pressures being performed: No  BP Readings from Last 3 Encounters:   01/12/19 134/78   07/08/18 122/70   06/19/18 128/78     He denies significant light-headedness.  Imp: Hypertension controlled     Plan:   Discussed hypertension and reviewed goals.  Are barriers to achieving goals present? No  Medication education provided. Patient voiced understanding? Yes  Patient able to self-manage and  ready to comply? Yes  Educational resources identified? Verbal Counseling     Weber Cooks. Willmott was seen today for dme order.    Diagnoses and all orders for this visit:    Essential hypertension, benign  -     CBC AND DIFF; Future; Expected date: 08/10/2019  -     BASIC METABOLIC PANEL; Future; Expected date: 08/10/2019    Primary osteoarthritis of both hips  -     PR LIGHTWEIGHT WHEELCHAIR    Mobility impaired  -     PR LIGHTWEIGHT WHEELCHAIR    I reviewed with the patient their current medications and specifically any new medications prescribed at the time of this visit and we reviewed the expected benefits and potential side effects. All questions are answered to the patient's satisfaction.         Visit Start Time 4:24PM Visit End Time 4:36 PM.

## 2019-08-10 NOTE — Telephone Encounter
Last Office visit 03/01/19  Future Appointments   Date Time Provider [place]   08/10/2019  3:45 PM Roberts Gaudy [place]   09/06/2019 11:15 AM Vincent Peyer, [place]

## 2019-08-10 NOTE — Patient Instructions
Health Maintenance   Topic Date Due    DTAP/TDAP VACCINES (1 - Tdap) 09/18/1970    SHINGLES RECOMBINANT VACCINE (1 of 2) 09/18/2002    PHYSICAL (COMPREHENSIVE) EXAM  11/05/2018    PNEUMONIA (PPSV23) VACCINE (2 of 2 - PPSV23) 11/05/2018    INFLUENZA VACCINE  03/13/2019    MEDICARE ANNUAL WELLNESS VISIT  02/29/2020    COLORECTAL CANCER SCREENING  05/02/2020    ABDOMINAL AORTIC ANEURYSM SCREENING  Completed    HEPATITIS C SCREENING  Completed

## 2019-08-11 ENCOUNTER — Encounter: Admit: 2019-08-11 | Discharge: 2019-08-11 | Payer: Medicare Other

## 2019-08-11 DIAGNOSIS — M160 Bilateral primary osteoarthritis of hip: Secondary | ICD-10-CM

## 2019-08-11 DIAGNOSIS — Z7409 Other reduced mobility: Secondary | ICD-10-CM
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
## 2019-08-12 ENCOUNTER — Encounter: Admit: 2019-08-12 | Discharge: 2019-08-12 | Payer: Medicare Other

## 2019-08-12 NOTE — Telephone Encounter
Fleet ContrasRachel with [employer] called, saying pt wants a wheeled walker with seat and breaks. (rollator)  Would you be able to addend the notes.     I l/m with pt and with Fleet ContrasRachel to let them know I will have to check with you.

## 2019-08-17 ENCOUNTER — Encounter: Admit: 2019-08-17 | Discharge: 2019-08-17 | Payer: Medicare Other

## 2019-08-17 DIAGNOSIS — M160 Bilateral primary osteoarthritis of hip: Secondary | ICD-10-CM

## 2019-08-17 NOTE — Telephone Encounter
I left message that I am faxing his addendum'ed notes for his walker/rollator.     Also, just realized I need to send another order too. Will do.

## 2019-08-23 ENCOUNTER — Encounter: Admit: 2019-08-23 | Discharge: 2019-08-23 | Payer: Medicare Other

## 2019-08-23 NOTE — Telephone Encounter
Pt needs me to fax his labs again to [facility].

## 2019-08-24 ENCOUNTER — Encounter: Admit: 2019-08-24 | Discharge: 2019-08-24 | Payer: Medicare Other

## 2019-08-24 DIAGNOSIS — I10 Essential (primary) hypertension: Secondary | ICD-10-CM

## 2019-08-24 LAB — CBC AND DIFF
Lab: 10 g/dL — ABNORMAL LOW (ref 13.5–16.5)
Lab: 109
Lab: 213 M/UL — ABNORMAL LOW (ref 4.4–5.5)
Lab: 38
Lab: 39

## 2019-08-24 LAB — BASIC METABOLIC PANEL
Lab: 102
Lab: 139
Lab: 31
Lab: 4.5
Lab: 0.7
Lab: 10
Lab: 93

## 2019-09-02 ENCOUNTER — Encounter: Admit: 2019-09-02 | Discharge: 2019-09-02 | Payer: Medicare Other

## 2019-09-02 NOTE — Telephone Encounter
Pt l/m. R/c and l/m about labs.

## 2019-09-06 ENCOUNTER — Encounter: Admit: 2019-09-06 | Discharge: 2019-09-06 | Payer: Medicare Other

## 2019-09-06 ENCOUNTER — Ambulatory Visit: Admit: 2019-09-06 | Discharge: 2019-09-06 | Payer: Medicare Other

## 2019-09-06 DIAGNOSIS — R05 Cough: Secondary | ICD-10-CM

## 2019-09-06 DIAGNOSIS — F3340 Major depressive disorder, recurrent, in remission, unspecified: Secondary | ICD-10-CM

## 2019-09-06 DIAGNOSIS — J410 Simple chronic bronchitis: Secondary | ICD-10-CM

## 2019-09-06 DIAGNOSIS — R350 Frequency of micturition: Secondary | ICD-10-CM

## 2019-09-06 DIAGNOSIS — G609 Hereditary and idiopathic neuropathy, unspecified: Secondary | ICD-10-CM

## 2019-09-06 DIAGNOSIS — F329 Major depressive disorder, single episode, unspecified: Secondary | ICD-10-CM

## 2019-09-06 DIAGNOSIS — I10 Essential (primary) hypertension: Secondary | ICD-10-CM

## 2019-09-06 DIAGNOSIS — Z20822 Encounter for screening laboratory testing for COVID-19 virus: Secondary | ICD-10-CM

## 2019-09-06 DIAGNOSIS — B20 Human immunodeficiency virus [HIV] disease: Secondary | ICD-10-CM

## 2019-09-06 DIAGNOSIS — R5383 Other fatigue: Secondary | ICD-10-CM

## 2019-09-06 DIAGNOSIS — Z21 Asymptomatic human immunodeficiency virus [HIV] infection status: Secondary | ICD-10-CM

## 2019-09-06 NOTE — Patient Instructions
Controlling High Blood Pressure  High blood pressure (hypertension) is often called the silent killer. This is because many people who have it don't know it. High blood pressure can raise your risk of heart attack, stroke, and heart failure. Controlling your blood pressure can decrease your risk of these problems. Know your blood pressure and remember to check it regularly. Doing so can save your life.  Blood pressure measurements are given as 2 numbers. Systolic blood pressure is the upper number. This is the pressure when the heart contracts. Diastolic blood pressure is the lower number. This is the pressure when the heart relaxes between beats.  Blood pressure is categorized as normal, elevated, or stage 1 or stage 2 high blood pressure:   Normal blood pressure is systolic of less than 120 and diastolic of less than 80 (120/80)   Elevated blood pressure is systolic of 120 to 129 and diastolic less than 80   Stage 1 high blood pressure is systolic is 130 to 139 or diastolic between 80 to 89   Stage 2 high blood pressure is when systolic is 140 or higher or the diastolic is 90 or higher  Here are some things you can do to help control your blood pressure.    Choose heart-healthy foods   Select low-salt, low-fat foods. Limit sodium intake to 2,400 mg per day or the amount suggested by your healthcare provider.   Limit canned, dried, cured, packaged, and fast foods. These can contain a lot of salt.   Eat 8 to 10 servings of fruits and vegetables every day.   Choose lean meats, fish, or chicken.   Eat whole-grain pasta, brown rice, and beans.   Eat 2 to 3 servings of low-fat or fat-free dairy products.   Ask your doctor about the DASH eating plan. This plan helps reduce blood pressure.   When you go to a restaurant, ask that your meal be prepared with no added salt.  Maintain a healthy weight   Ask your healthcare provider how many calories to eat a day. Then stick to that number.   Ask your healthcare  provider what weight range is healthiest for you. If you are overweight, a weight loss of only 3% to 5% of your body weightcan help lower blood pressure. Generally, a good weight loss goal is to lose 10% of your body weight in a year.   Limit snacks and sweets.   Get regular exercise.  Get up and get active   Choose activities you enjoy. Find ones you can do with friends or family. This includes bicycling, dancing, walking, and jogging.   Park farther away from building entrances.   Use stairs instead of the elevator.   When you can, walk or bike instead of driving.   Rake leaves, garden, or do household repairs.   Be active at a moderate to vigorous level of physical activity for at least 40 minutes for a minimum of 3 to 4 days a week.  Manage stress   Make time to relax and enjoy life. Find time to laugh.   Communicate your concerns with your loved ones and your healthcare provider.   Visit with family and friends, and keep up with hobbies.  Limit alcohol and quit smoking   Men should have no more than 2 drinks per day.   Women should have no more than 1 drink per day.   Talk with your healthcare provider about quitting smoking. Smoking significantly increases your risk for heart   disease and stroke. Ask your healthcare provider about community smoking cessation programs and other options.  Medicines  If lifestyle changes aren't enough, your healthcare provider may prescribe high blood pressure medicine. Take all medicines as prescribed. If you have any questions about your medicines, ask your healthcare provider before stopping or changing them.   Date Last Reviewed: 12/07/2014   2000-2018 [employer] [address], [place] [address]. All rights reserved. This information is not intended as a substitute for professional medical care. Always follow your healthcare professional's instructions.

## 2019-09-06 NOTE — Progress Notes
Pt gives consent for today's visit
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
## 2019-10-22 ENCOUNTER — Encounter: Admit: 2019-10-22 | Discharge: 2019-10-22 | Payer: Medicare Other

## 2019-10-22 MED ORDER — ALBUTEROL SULFATE 90 MCG/ACTUATION IN HFAA
Freq: Four times a day (QID) | 0 refills | Status: DC | PRN
Start: 2019-10-22 — End: 2019-11-17

## 2019-11-17 ENCOUNTER — Encounter: Admit: 2019-11-17 | Discharge: 2019-11-17 | Payer: Medicare Other

## 2019-11-17 MED ORDER — ALBUTEROL SULFATE 90 MCG/ACTUATION IN HFAA
Freq: Four times a day (QID) | 0 refills | Status: DC | PRN
Start: 2019-11-17 — End: 2019-12-14

## 2019-12-14 ENCOUNTER — Encounter: Admit: 2019-12-14 | Discharge: 2019-12-14 | Payer: Medicare Other

## 2019-12-14 MED ORDER — SERTRALINE 50 MG PO TAB
ORAL_TABLET | Freq: Every day | 3 refills | Status: AC
Start: 2019-12-14 — End: ?

## 2019-12-14 MED ORDER — ADVAIR DISKUS 250-50 MCG/DOSE IN DSDV
1 | Freq: Two times a day (BID) | RESPIRATORY_TRACT | 3 refills | Status: AC
Start: 2019-12-14 — End: ?

## 2019-12-14 MED ORDER — ALBUTEROL SULFATE 90 MCG/ACTUATION IN HFAA
Freq: Four times a day (QID) | 0 refills | Status: AC | PRN
Start: 2019-12-14 — End: ?

## 2019-12-14 NOTE — Telephone Encounter
Last seen 09/06/2019  Last filled 08/10/2019

## 2020-03-20 ENCOUNTER — Encounter: Admit: 2020-03-20 | Discharge: 2020-03-20 | Payer: Medicare Other

## 2020-03-20 NOTE — Telephone Encounter
Pt notes he has aches and pains and runny nose and wanted a covid test. I spoke to him and he was very SOA. He states he had just walked across the room and that was why he was SOA. However, after 5-6 mins, he was still SOA. I advised he should go to ER.     He states he has been vaccinated and has been secluded, except going to [facility]. I stressed going to ER -- especially since he had a similar issue last year, bronchitis.

## 2020-05-16 ENCOUNTER — Encounter: Admit: 2020-05-16 | Discharge: 2020-05-16 | Payer: Medicare Other

## 2020-05-16 DIAGNOSIS — F3340 Major depressive disorder, recurrent, in remission, unspecified: Secondary | ICD-10-CM

## 2020-05-16 MED ORDER — SERTRALINE 50 MG PO TAB
ORAL_TABLET | Freq: Every day | 0 refills | Status: AC
Start: 2020-05-16 — End: ?

## 2020-05-16 NOTE — Telephone Encounter
Some Protocol elements NOT Met  Medication name: Zoloft  Medication Strength: 50mg   Quantity: 30 day supply sent with no refills    Valid encounter within last 6 months   Patient is < 67 years old. Beers Criteria: Use with caution in patients 65+     Patient was last seen on 09/06/19. Courtesy refill sent. Needs follow up before next refill.

## 2020-06-27 ENCOUNTER — Encounter: Admit: 2020-06-27 | Discharge: 2020-06-27 | Payer: Medicare Other

## 2020-06-27 MED ORDER — ALBUTEROL SULFATE 90 MCG/ACTUATION IN HFAA
Freq: Four times a day (QID) | 0 refills | PRN
Start: 2020-06-27 — End: ?

## 2020-06-28 ENCOUNTER — Encounter: Admit: 2020-06-28 | Discharge: 2020-06-28 | Payer: Medicare Other

## 2020-06-28 NOTE — Telephone Encounter
Nurse Boyd Kerbs with [facility] states she admitted the patient over the weekend for home health. Nurse reports patient was recently diagnosed with congestive heart failure and due to this home health nursing will be following with him for a couple of months to monitor his weight and new medications. Nurse is calling to see if PCP Dr. Lovina Reach will give verbal order for this.     Routing to Dr. Lovina Reach to approve.

## 2020-06-30 ENCOUNTER — Encounter: Admit: 2020-06-30 | Discharge: 2020-06-30 | Payer: Medicare Other

## 2020-06-30 NOTE — Telephone Encounter
Physical Therapist Neysa Bonito with [facility] states she just saw patient for a PT evaluation for home health and wants to continue to see him. Requesting to put in 2 visits a week for 4 weeks to work on his endurance, breathing, strength, and balance. Needs verbal order to continue the plan and treatment.     Returned call and gave verbal order to continue treatment.    Brunetta Jeans, RN

## 2020-07-10 ENCOUNTER — Encounter: Admit: 2020-07-10 | Discharge: 2020-07-10 | Payer: Medicare Other

## 2020-07-18 ENCOUNTER — Encounter: Admit: 2020-07-18 | Discharge: 2020-07-18 | Payer: Medicare Other

## 2020-07-18 NOTE — Telephone Encounter
[facility] Emily l/m asking for verbal to have a SW see pt for meals on Wheels and other needs.

## 2020-08-23 ENCOUNTER — Encounter: Admit: 2020-08-23 | Discharge: 2020-08-23 | Payer: Medicare Other

## 2020-08-23 DIAGNOSIS — F3340 Major depressive disorder, recurrent, in remission, unspecified: Secondary | ICD-10-CM

## 2020-08-23 MED ORDER — SERTRALINE 50 MG PO TAB
ORAL_TABLET | Freq: Every day | 0 refills | Status: AC
Start: 2020-08-23 — End: ?

## 2020-08-25 ENCOUNTER — Encounter: Admit: 2020-08-25 | Discharge: 2020-08-25 | Payer: Medicare Other

## 2020-08-25 MED ORDER — JARDIANCE 10 MG PO TAB
ORAL_TABLET | Freq: Every day | 1 refills | Status: AC
Start: 2020-08-25 — End: ?

## 2020-08-25 MED ORDER — FUROSEMIDE 40 MG PO TAB
ORAL_TABLET | Freq: Every day | ORAL | 1 refills | 90.00000 days | Status: AC
Start: 2020-08-25 — End: ?

## 2020-08-25 MED ORDER — SULFAMETHOXAZOLE-TRIMETHOPRIM 800-160 MG PO TAB
ORAL_TABLET | 1 refills | Status: AC
Start: 2020-08-25 — End: ?

## 2020-08-25 NOTE — Telephone Encounter
Pt was in hospital in Nov, the info is in care every where: below is a list of meds he was [facility] with. I gave him a AWV appt and he is now home bound. He is going to set up Mychart.     Will you Rx the pending meds:     empagliflozin 10 mg tablet  Commonly known as: JARDIANCE  10 mg, Oral, Daily  CHF   furosemide 40 MG tablet    sulfamethoxazole-trimethoprim 800-160 mg per tablet  Commonly known as: BACTRIM DS  160 mg of trimethoprim, Oral, 3 times weekly  Start taking on: June 26, 2020  Indication: CHRONIC SUPPRESSIVE TREATMENT OR PROPHYLAXIS      Modified Medications   Indication(s)   Biktarvy 50-200-25 mg tablet  Generic drug: bictegravir-emtricitabine-tenofovir  1 tablet, Oral, Daily  Start taking on: June 25, 2020  What changed:   ? medication strength  ? how much to take  ? when to take this  ? Another medication with the same name was removed. Continue taking this medication, and follow the directions you see here.  Indication: HIV    fluticasone propion-salmeteroL 250-50 mcg/dose DISKUS  Commonly known as: Advair Diskus  1 puff, Inhalation, 2 times daily  What changed:   ? how much to take  ? how to take this  ? when to take this  ? Another medication with the same name was removed. Continue taking this medication, and follow the directions you see here.  Indication: bronchospasm prevention with COPD    sertraline 50 mg tablet  Commonly known as: ZOLOFT  75 mg, Oral, Every morning  What changed:   ? how much to take  ? how to take this  ? when to take this  ? Another medication with the same name was removed. Continue taking this medication, and follow the directions you see here.  depression     Medications To Continue   Indication(s)   Ambien 5 MG tablet  Generic drug: zolpidem  take 1 tablet by ORAL route every day at bedtime  insomnia   aspirin 325 MG EC tablet  325 mg, Oral, 2 times daily  CHF   azithromycin 250 MG tablet  Commonly known as: ZITHROMAX  Take 2 tablets (500 mg total) on day 1. Take 1 tablet (250 mg total) by mouth daily from day 2-4.  prophylaxis   cyanocobalamin 100 MCG tablet  Commonly known as: VITAMIN B-12  100 mcg, Oral, Daily  Vitamin D deficiency   Ensure Liqd  Generic drug: food supplemt, lactose-reduced  1 can BID  malnutrition   fluticasone propionate 50 mcg/actuation nasal spray  Commonly known as: FLONASE  1 spray, Nasal, 2 times daily  rhinitis   HIGH POTENCY MULTIVITAMIN-MIN ORAL  Oral  Vitamin deficiency   HYDROcodone-acetaminophen 7.5-325 mg per tablet  Commonly known as: NORCO  1-2 tablets, Oral, Every 4 to 6 hours as needed  pain

## 2020-09-11 ENCOUNTER — Encounter: Admit: 2020-09-11 | Discharge: 2020-09-11 | Payer: Medicare Other

## 2020-09-11 ENCOUNTER — Ambulatory Visit: Admit: 2020-09-11 | Discharge: 2020-09-11 | Payer: Medicare Other

## 2020-09-11 DIAGNOSIS — F3340 Major depressive disorder, recurrent, in remission, unspecified: Secondary | ICD-10-CM

## 2020-09-11 DIAGNOSIS — F32A Depression: Secondary | ICD-10-CM

## 2020-09-11 DIAGNOSIS — I10 Essential (primary) hypertension: Secondary | ICD-10-CM

## 2020-09-11 DIAGNOSIS — J410 Simple chronic bronchitis: Secondary | ICD-10-CM

## 2020-09-11 DIAGNOSIS — Z21 Asymptomatic human immunodeficiency virus [HIV] infection status: Secondary | ICD-10-CM

## 2020-09-11 DIAGNOSIS — B20 Human immunodeficiency virus [HIV] disease: Secondary | ICD-10-CM

## 2020-09-11 DIAGNOSIS — G609 Hereditary and idiopathic neuropathy, unspecified: Secondary | ICD-10-CM

## 2020-09-11 DIAGNOSIS — Z8679 Personal history of other diseases of the circulatory system: Secondary | ICD-10-CM

## 2020-09-11 MED ORDER — ZOLPIDEM 5 MG PO TAB
5 mg | ORAL_TABLET | Freq: Every evening | ORAL | 0 refills | Status: CN
Start: 2020-09-11 — End: ?

## 2020-09-11 NOTE — Progress Notes
Date of Service: 09/11/2020    Anthony Whitehead is a 70 y.o. male.  DOB: 06-24-1951  MRN: [id number]     Subjective:             History of Present Illness  Obtained patient's, or patient proxy's, verbal consent to treat them and their agreement to [facility] financial policy and NPP via this telehealth visit during the [facility] Emergency    This visit was conducted today via telephone the patient's request to the recent coronavirus public health emergency.  Tyke has not had an in office visit here in over a year and a half, he has had some telehealth visits.  He lives in [place] and he states he has transportation problems.  I advised him that we need to get him into the office so we can evaluate him more closely.  He states he did go into [facility] in November for what appears to be congestive heart failure and he is currently seeing a cardiologist now up [place].  He states he does not have any current chest pain or shortness of breath.  He also is seeing a new infectious disease doctor for his history of HIV infection.  His chronic bronchitis appears to be under good control at this time.  He also states he is going to set up an appointment with a psychiatrist for his history of depression which appears to be fairly stable at this point.  We will get him set up for an in office visit.         Review of Systems   Constitutional: Negative.    HENT: Negative.    Eyes: Negative.    Respiratory: Negative.    Cardiovascular: Negative.    Gastrointestinal: Negative.    Endocrine: Negative.    Genitourinary: Negative.    Musculoskeletal: Negative.    Skin: Negative.    Allergic/Immunologic: Negative.    Neurological: Negative.    Hematological: Negative.    Psychiatric/Behavioral: Negative.          Objective:         ? ADVAIR DISKUS 250-50 mcg/dose inhalation disk INHALE ONE PUFF BY MOUTH INTO THE LUNGS TWICE DAILY   ? albuterol sulfate (PROAIR HFA) 90 mcg/actuation HFA aerosol inhaler INHALE 2 PUFFS BY MOUTH EVERY 6 HOURS AS NEEDED FOR WHEEZING OR SHORTNESS OF BREATH. SHAKE WELL BEFORE USE.   ? bictegravir-emtricitabine-tenofovir ala (BIKTARVY) 50-200-25 mg tablet Take 1 tablet by mouth daily.   ? cyanocobalamin(+) (VITAMIN B-12) 100 mcg tablet Take 100 mcg by mouth daily.   ? furosemide (LASIX) 40 mg tablet TAKE 1 TABLET BY MOUTH EVERY DAY   ? JARDIANCE 10 mg tablet TAKE 1 TABLET BY MOUTH EVERY DAY   ? MULTIVITAMIN PO Take  by mouth.     ? nicotine (NICODERM CQ STEP 1) 21 mg/day patch Apply one patch to top of skin as directed every 24 hours. Rotate patch location.  Indications: Stop Smoking   ? sertraline (ZOLOFT) 50 mg tablet TAKE 2 TABLETS BY MOUTH DAILY   ? spironolactone (ALDACTONE) 25 mg tablet Take 25 mg by mouth daily.   ? trimethoprim/sulfamethoxazole (BACTRIM DS) 160/800 mg tablet TAKE 1 TABLET (160 MG OF TRIMETHOPRIM TOTAL) BY MOUTH 3 (THREE) TIMES A WEEK.   ? zolpidem (AMBIEN) 5 mg tablet Take 1 tablet by mouth at bedtime daily.     There were no vitals filed for this visit.  There is no height or weight on file to  calculate BMI.     Physical Exam  Neurological:      General: No focal deficit present.      Mental Status: He is alert and oriented to person, place, and time.   Psychiatric:         Mood and Affect: Mood normal.         Behavior: Behavior normal.              Assessment and Plan:  Weber Cooks. Seales was seen today for follow up.    Diagnoses and all orders for this visit:    History of congestive heart failure    Simple chronic bronchitis (HCC)    Asymptomatic HIV infection (HCC)    Recurrent major depressive disorder, in remission (HCC)    I reviewed with the patient their current medications and specifically any new medications prescribed at the time of this visit and we reviewed the expected benefits and potential side effects. All questions are answered to the patient's satisfaction.          Visit Start Time 11:55 AM Visit End Time 12:10 PM..

## 2020-09-11 NOTE — Progress Notes
Obtained patient's verbal consent to treat them and their agreement to [facility] financial policy and NPP via this telephone visit with Dr Lovina Reach for todays visit. Pt provided all vitals reported for this visit.

## 2020-09-11 NOTE — Patient Instructions
Health Maintenance   Topic Date Due    DTAP/TDAP VACCINES (1 - Tdap) Never done    SHINGLES RECOMBINANT VACCINE (1 of 2) Never done    PNEUMONIA (PPSV23) VACCINE (3 of 4 - PPSV23) 12/30/2017    MEDICARE ANNUAL WELLNESS VISIT  02/29/2020    COLORECTAL CANCER SCREENING  05/02/2020    PHYSICAL (COMPREHENSIVE) EXAM  09/05/2020    COVID-19 VACCINE (4 - Booster) 11/22/2020    ABDOMINAL AORTIC ANEURYSM SCREENING  Completed    INFLUENZA VACCINE  Completed    HEPATITIS C SCREENING  Completed

## 2020-09-21 ENCOUNTER — Encounter

## 2020-09-21 DIAGNOSIS — F3340 Major depressive disorder, recurrent, in remission, unspecified: Secondary | ICD-10-CM

## 2020-09-21 MED ORDER — SERTRALINE 50 MG PO TAB
ORAL_TABLET | Freq: Every day | 0 refills
Start: 2020-09-21 — End: ?

## 2020-10-24 ENCOUNTER — Encounter: Admit: 2020-10-24 | Discharge: 2020-10-24 | Payer: Medicare Other

## 2020-10-24 DIAGNOSIS — F3340 Major depressive disorder, recurrent, in remission, unspecified: Secondary | ICD-10-CM

## 2020-10-24 MED ORDER — SERTRALINE 50 MG PO TAB
ORAL_TABLET | Freq: Every day | 0 refills | Status: AC
Start: 2020-10-24 — End: ?

## 2020-10-24 NOTE — Telephone Encounter
Last seen 09/11/20. Last filled 09/22/20

## 2020-10-27 ENCOUNTER — Encounter: Admit: 2020-10-27 | Discharge: 2020-10-27 | Payer: Medicare Other

## 2020-10-27 MED ORDER — JARDIANCE 10 MG PO TAB
ORAL_TABLET | Freq: Every day | 1 refills | Status: AC
Start: 2020-10-27 — End: ?

## 2020-10-27 MED ORDER — FUROSEMIDE 40 MG PO TAB
ORAL_TABLET | Freq: Every day | ORAL | 1 refills | 90.00000 days | Status: AC
Start: 2020-10-27 — End: ?

## 2020-10-27 MED ORDER — SULFAMETHOXAZOLE-TRIMETHOPRIM 800-160 MG PO TAB
ORAL_TABLET | 1 refills | Status: AC
Start: 2020-10-27 — End: ?

## 2020-10-30 ENCOUNTER — Encounter: Admit: 2020-10-30 | Discharge: 2020-10-30 | Payer: Medicare Other

## 2020-10-30 MED ORDER — ADVAIR DISKUS 250-50 MCG/DOSE IN DSDV
Freq: Two times a day (BID) | 3 refills | Status: AC
Start: 2020-10-30 — End: ?

## 2020-10-30 MED ORDER — ALBUTEROL SULFATE 90 MCG/ACTUATION IN HFAA
Freq: Four times a day (QID) | 0 refills | Status: AC | PRN
Start: 2020-10-30 — End: ?

## 2020-11-03 ENCOUNTER — Encounter: Admit: 2020-11-03 | Discharge: 2020-11-03 | Payer: Medicare Other

## 2020-11-03 NOTE — Telephone Encounter
Pt is frustrated with refills but they are from [facility] cardiology. I let him know I see that there are notes about trying to get the correct person's attention and they have advised he call them around 3 if he has not heard otherwise. Asking for flonase and Norco.

## 2020-11-24 ENCOUNTER — Encounter: Admit: 2020-11-24 | Discharge: 2020-11-24 | Payer: Medicare Other

## 2020-11-24 DIAGNOSIS — F3340 Major depressive disorder, recurrent, in remission, unspecified: Secondary | ICD-10-CM

## 2020-11-24 MED ORDER — SERTRALINE 50 MG PO TAB
ORAL_TABLET | Freq: Every day | 0 refills | Status: AC
Start: 2020-11-24 — End: ?

## 2020-11-24 NOTE — Telephone Encounter
Some Protocol elements NOT Met  Medication name: Zoloft  Medication Strength: 50mg       Off protocol     Patient is < 68 years old. Beers Criteria: Use with caution in patients 65+        Routed to Provider

## 2020-12-14 ENCOUNTER — Encounter: Admit: 2020-12-14 | Discharge: 2020-12-14 | Payer: Medicare Other

## 2020-12-14 DIAGNOSIS — 1 ERRONEOUS ENCOUNTER--DISREGARD: Secondary | ICD-10-CM

## 2020-12-14 NOTE — Progress Notes
This encounter was created in error. Please disregard.

## 2020-12-22 ENCOUNTER — Encounter: Admit: 2020-12-22 | Discharge: 2020-12-22 | Payer: Medicare Other

## 2020-12-22 DIAGNOSIS — F32A Depression: Secondary | ICD-10-CM

## 2020-12-22 DIAGNOSIS — B20 Human immunodeficiency virus [HIV] disease: Secondary | ICD-10-CM

## 2020-12-22 DIAGNOSIS — I10 Essential (primary) hypertension: Secondary | ICD-10-CM

## 2020-12-22 DIAGNOSIS — G609 Hereditary and idiopathic neuropathy, unspecified: Secondary | ICD-10-CM

## 2020-12-23 ENCOUNTER — Encounter: Admit: 2020-12-23 | Discharge: 2020-12-23 | Payer: Medicare Other

## 2020-12-23 MED ORDER — JARDIANCE 10 MG PO TAB
ORAL_TABLET | Freq: Every day | 0 refills | Status: AC
Start: 2020-12-23 — End: ?

## 2020-12-23 NOTE — Telephone Encounter
Some Protocol elements NOT Met   Cr in normal range and within 360 days    HBA1C is 7.9 or below and within 360 days    eGFR in normal range and within 360 days     Medication name: JARDIANCE 10 mg tablet    Abnormal labs   Per chart review, Cr 1.10 and eGFR 66.6 results received from [place] within range and last drawn on 03/  HBA1C 5.2 results received from [facility] last drawn on 06/17/20 and within range    Quantity: 90 tabs  Refills provided: 0    Patient was last seen on 09/11/20 and has follow up scheduled on 02/26/21.

## 2021-01-03 ENCOUNTER — Encounter: Admit: 2021-01-03 | Discharge: 2021-01-03 | Payer: Medicare Other

## 2021-01-03 MED ORDER — FUROSEMIDE 40 MG PO TAB
ORAL_TABLET | Freq: Every day | ORAL | 1 refills | 90.00000 days | Status: AC
Start: 2021-01-03 — End: ?

## 2021-01-03 NOTE — Telephone Encounter
Some Protocol elements NOT Met:  BP completed in the last 6 months   K in normal range and within 180 days   Na in normal range and within 180 days   Cr in normal range and within 180 days   eGFR in normal range and within 180 days   Medication name: Furosemide  Medication Strength: 40 mg    Off protocol and Labs due    Routed to Provider

## 2021-02-26 ENCOUNTER — Encounter: Admit: 2021-02-26 | Discharge: 2021-02-26 | Payer: Medicare Other

## 2021-02-27 IMAGING — CR XR KUB
2 series · 2 of 2 positions shown · non-contrast
Comparison: None.

Procedure(s): XR KUB
PROCEDURE:

XR ABDOMEN KUB 1-V
HISTORY: Vomiting, generalized abdominal pain.
TECHNIQUE: AP views of the abdomen and one view are submitted.

[kub (1 of 2)]
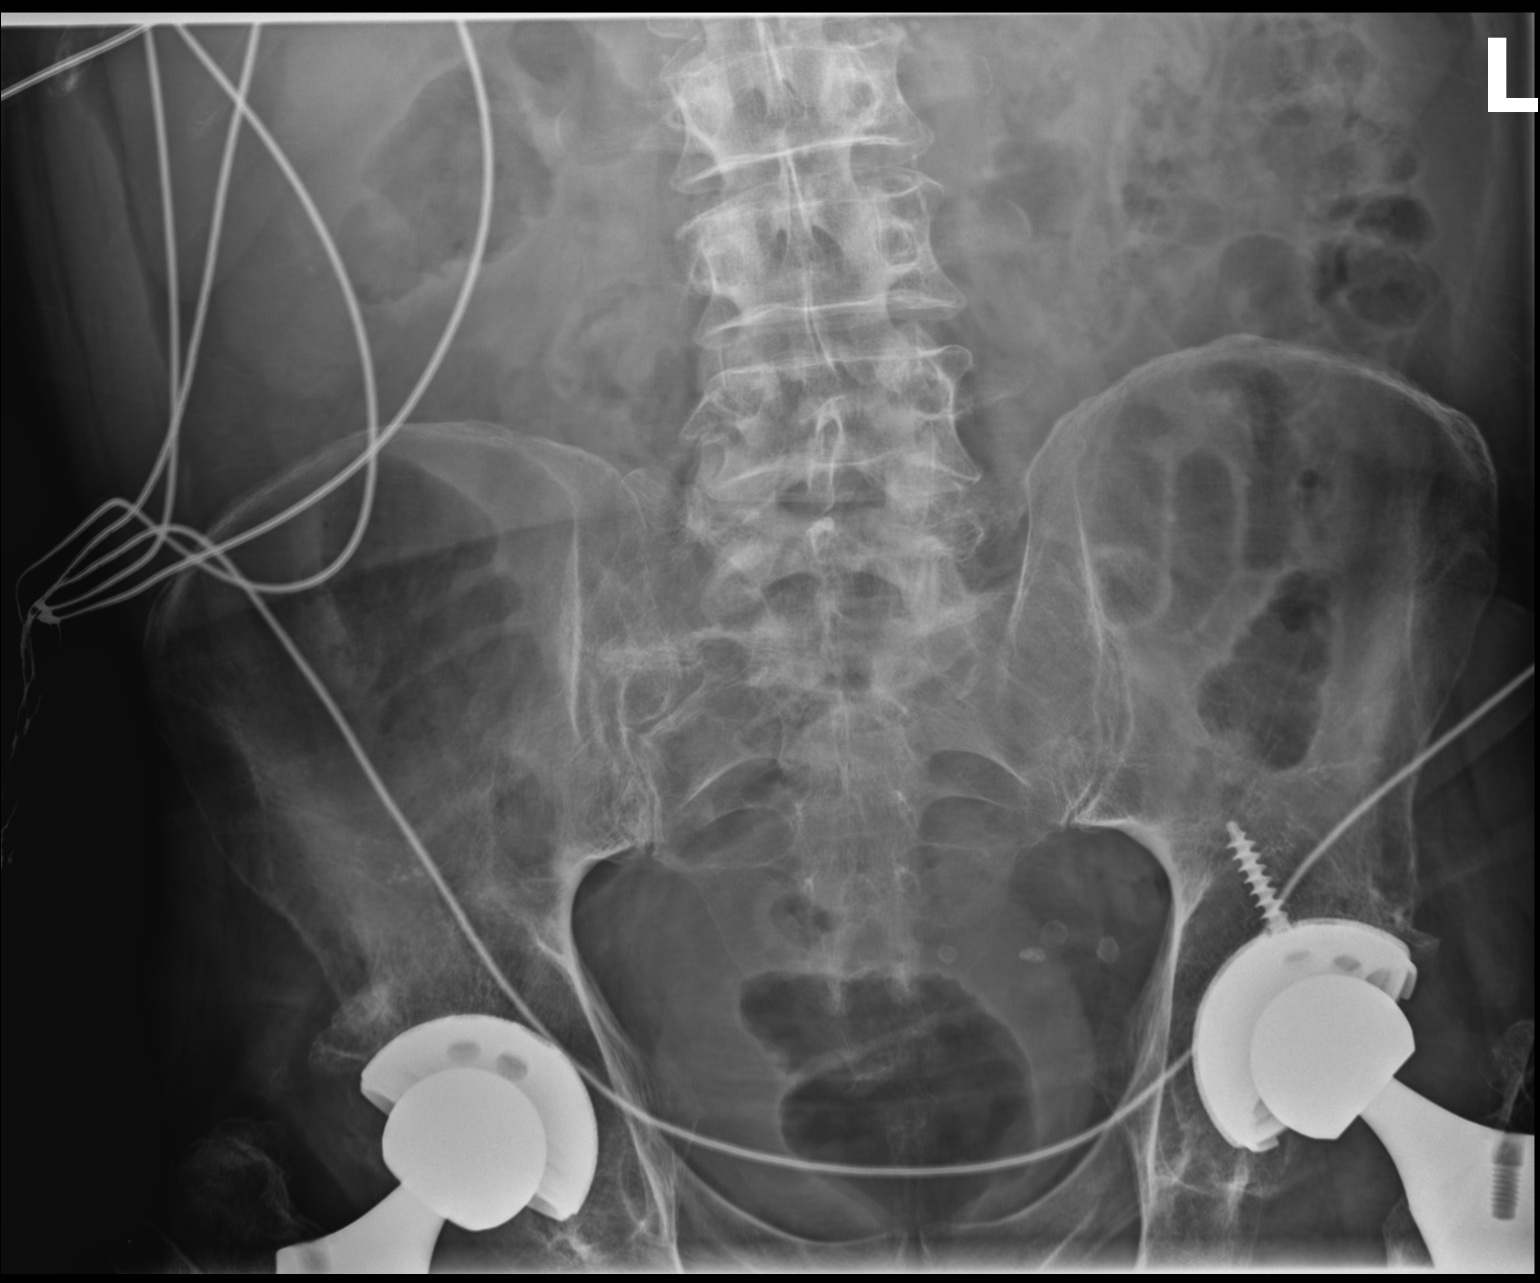

[kub (2 of 2)]
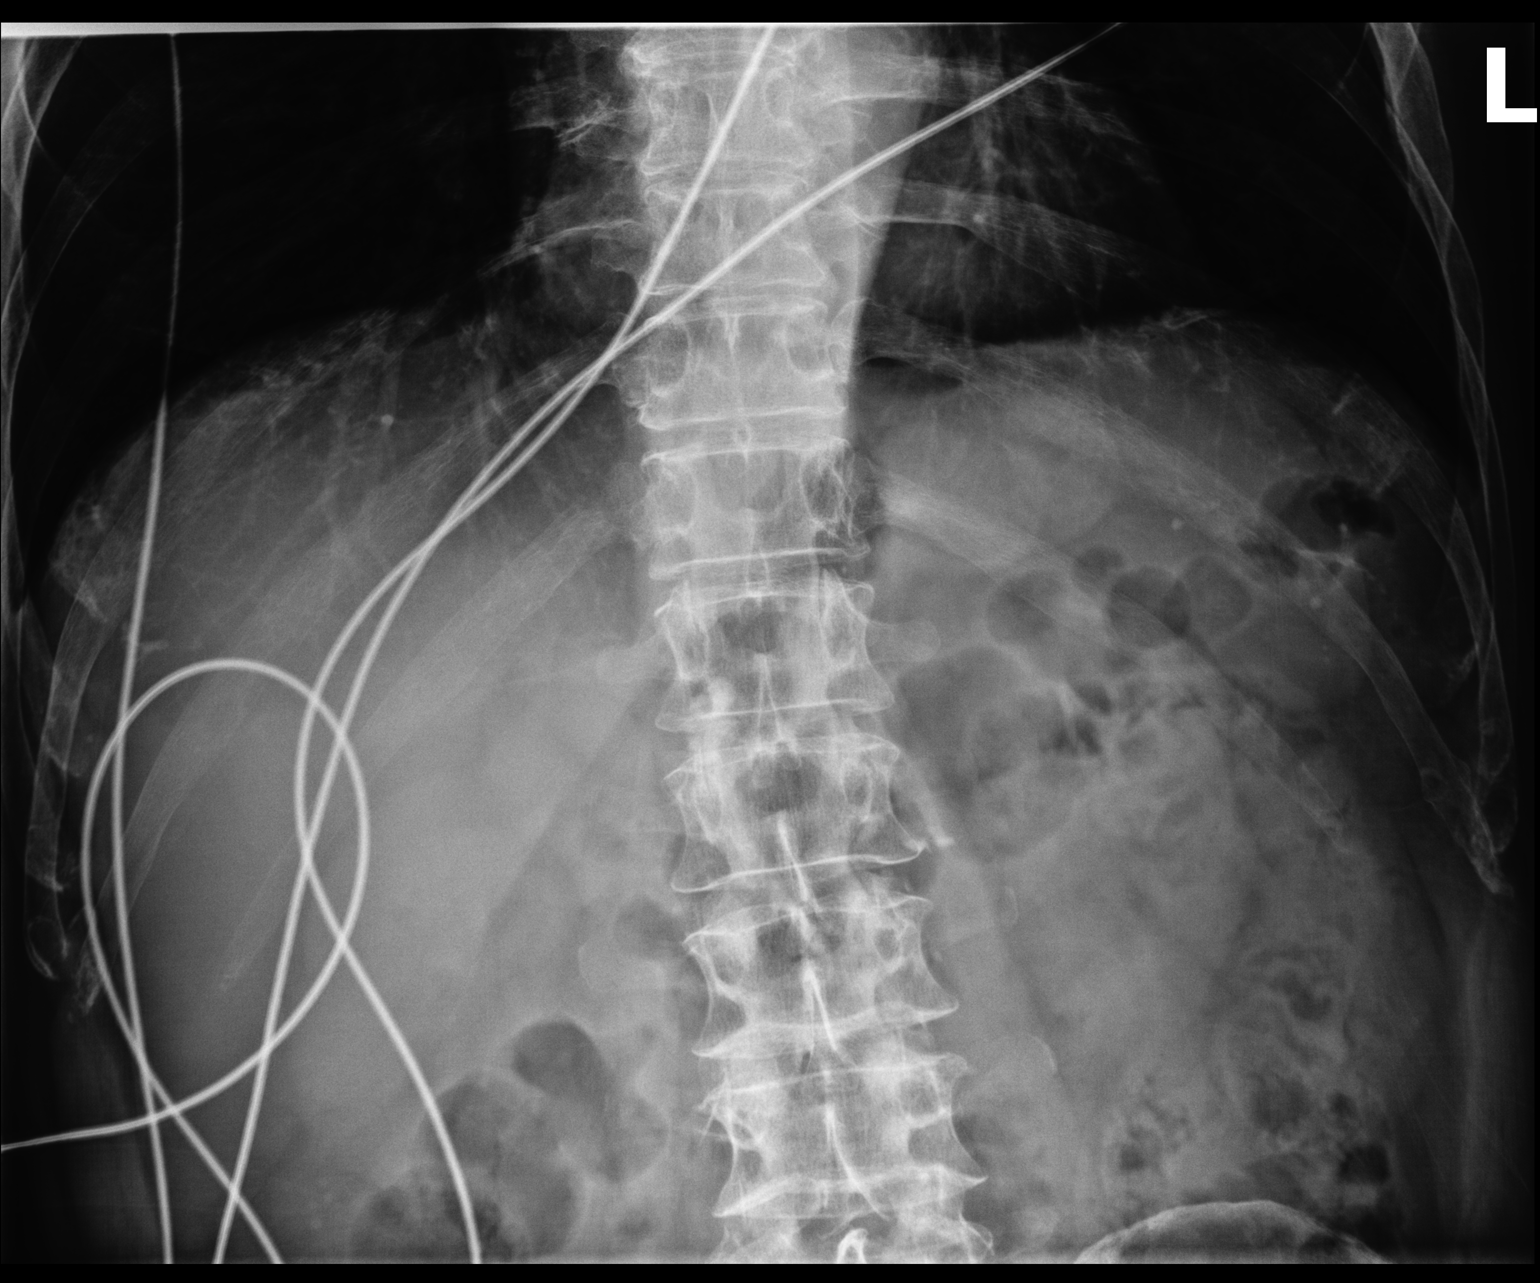

[2 of 2 positions shown; findings below may reference images not displayed]

FINDINGS: Nonspecific gas is seen throughout the small bowel.  There is gas and stool seen in the colon all
the way to the rectum.  Findings likely represent small bowel ileus versus enteritis.  No abnormal
radiopaque calculi are identified.  There are phleboliths in the pelvis.  Patient has bilateral hip
arthroplasties.  Mild spondylosis is noted in the spine.  Lung bases are not well demonstrated due
to technique.
IMPRESSION: Probable small bowel ileus versus enteritis.  Other findings, as noted.

## 2021-03-01 IMAGING — CR XR chest 2V
2 series · 2 of 2 positions shown · non-contrast
Comparison: 04/24/2019

Procedure(s): XR chest 2V
PROCEDURE:

XR CHEST 2-V
HISTORY: COPD exacerbation
TECHNIQUE: Two views of the chest are submitted.

[PA]
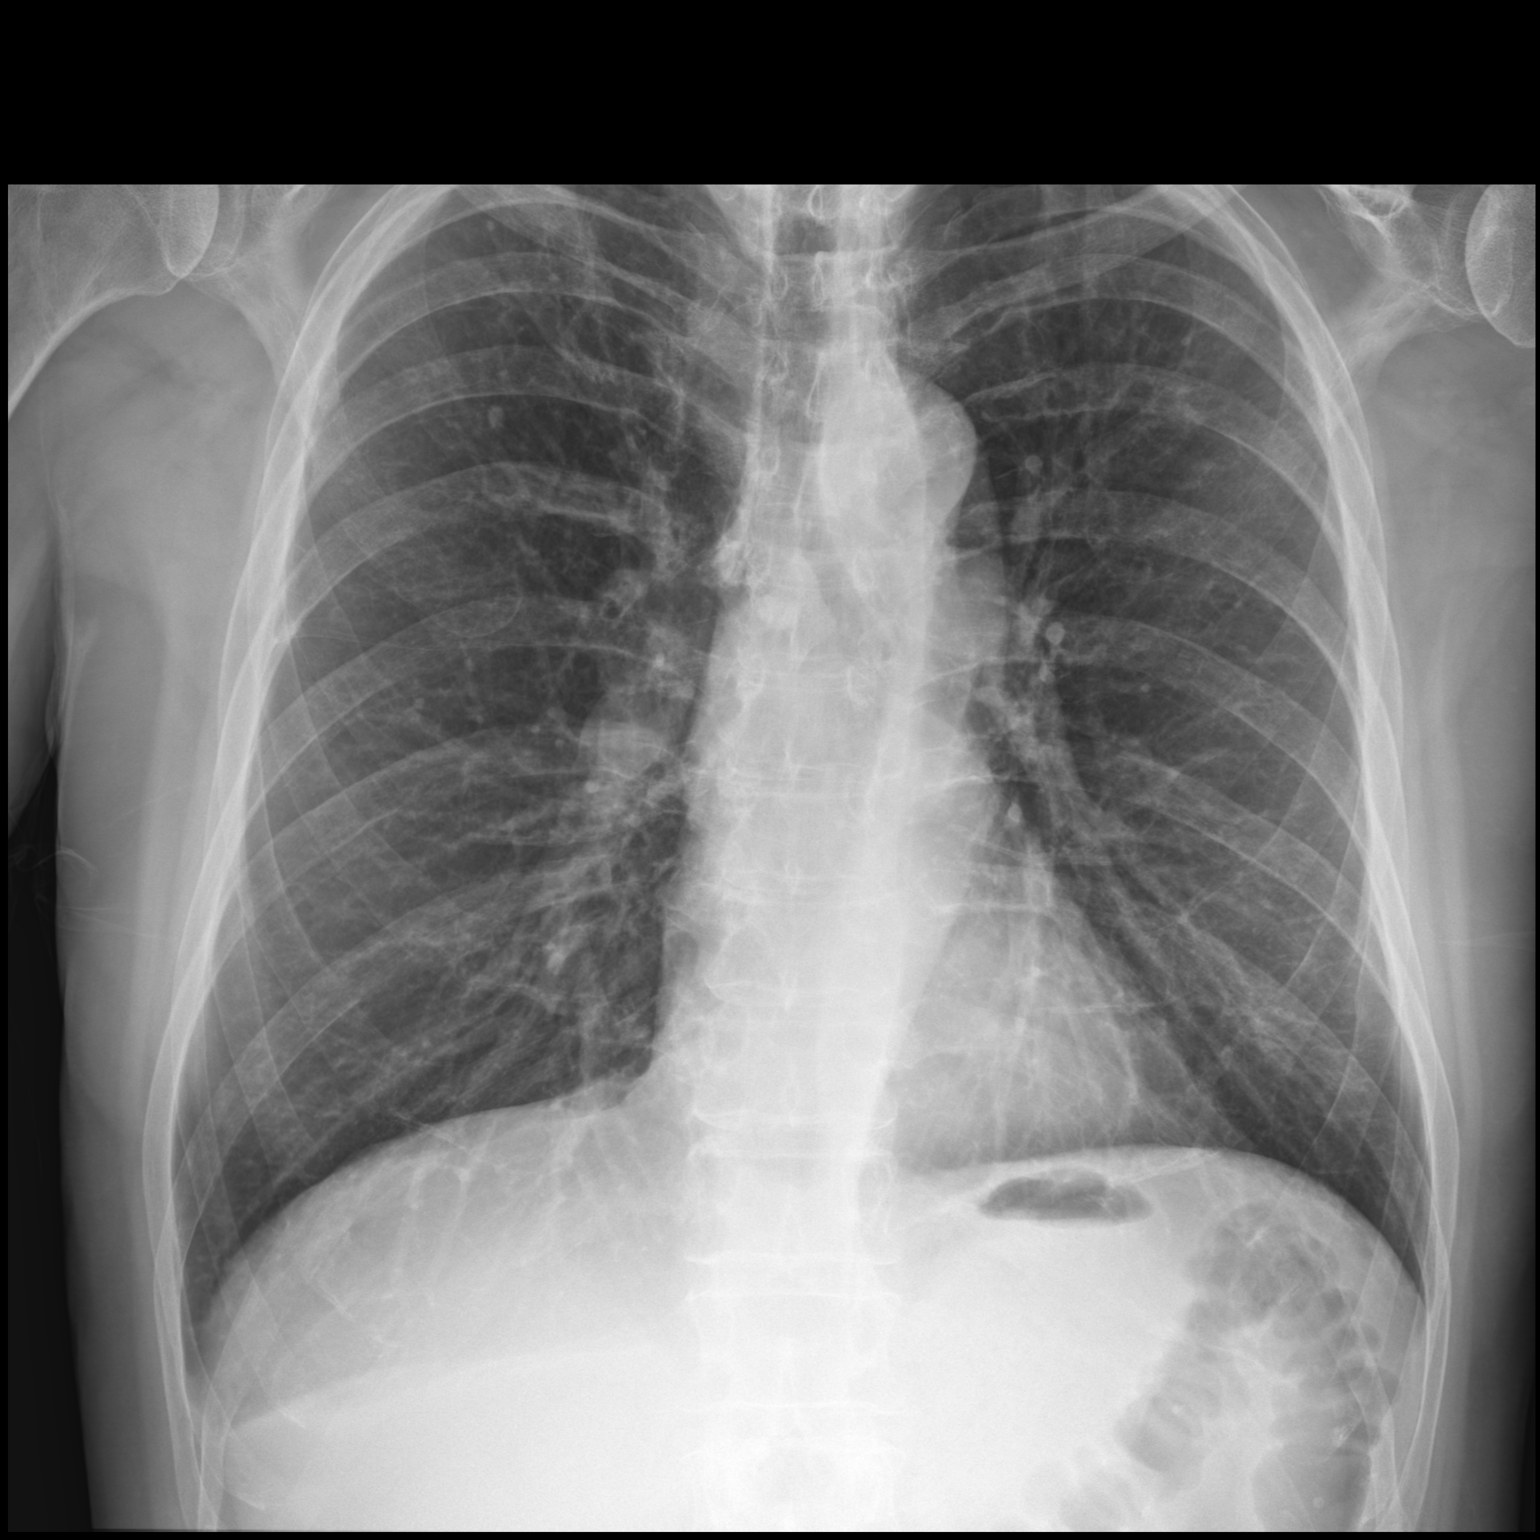

[lateral]
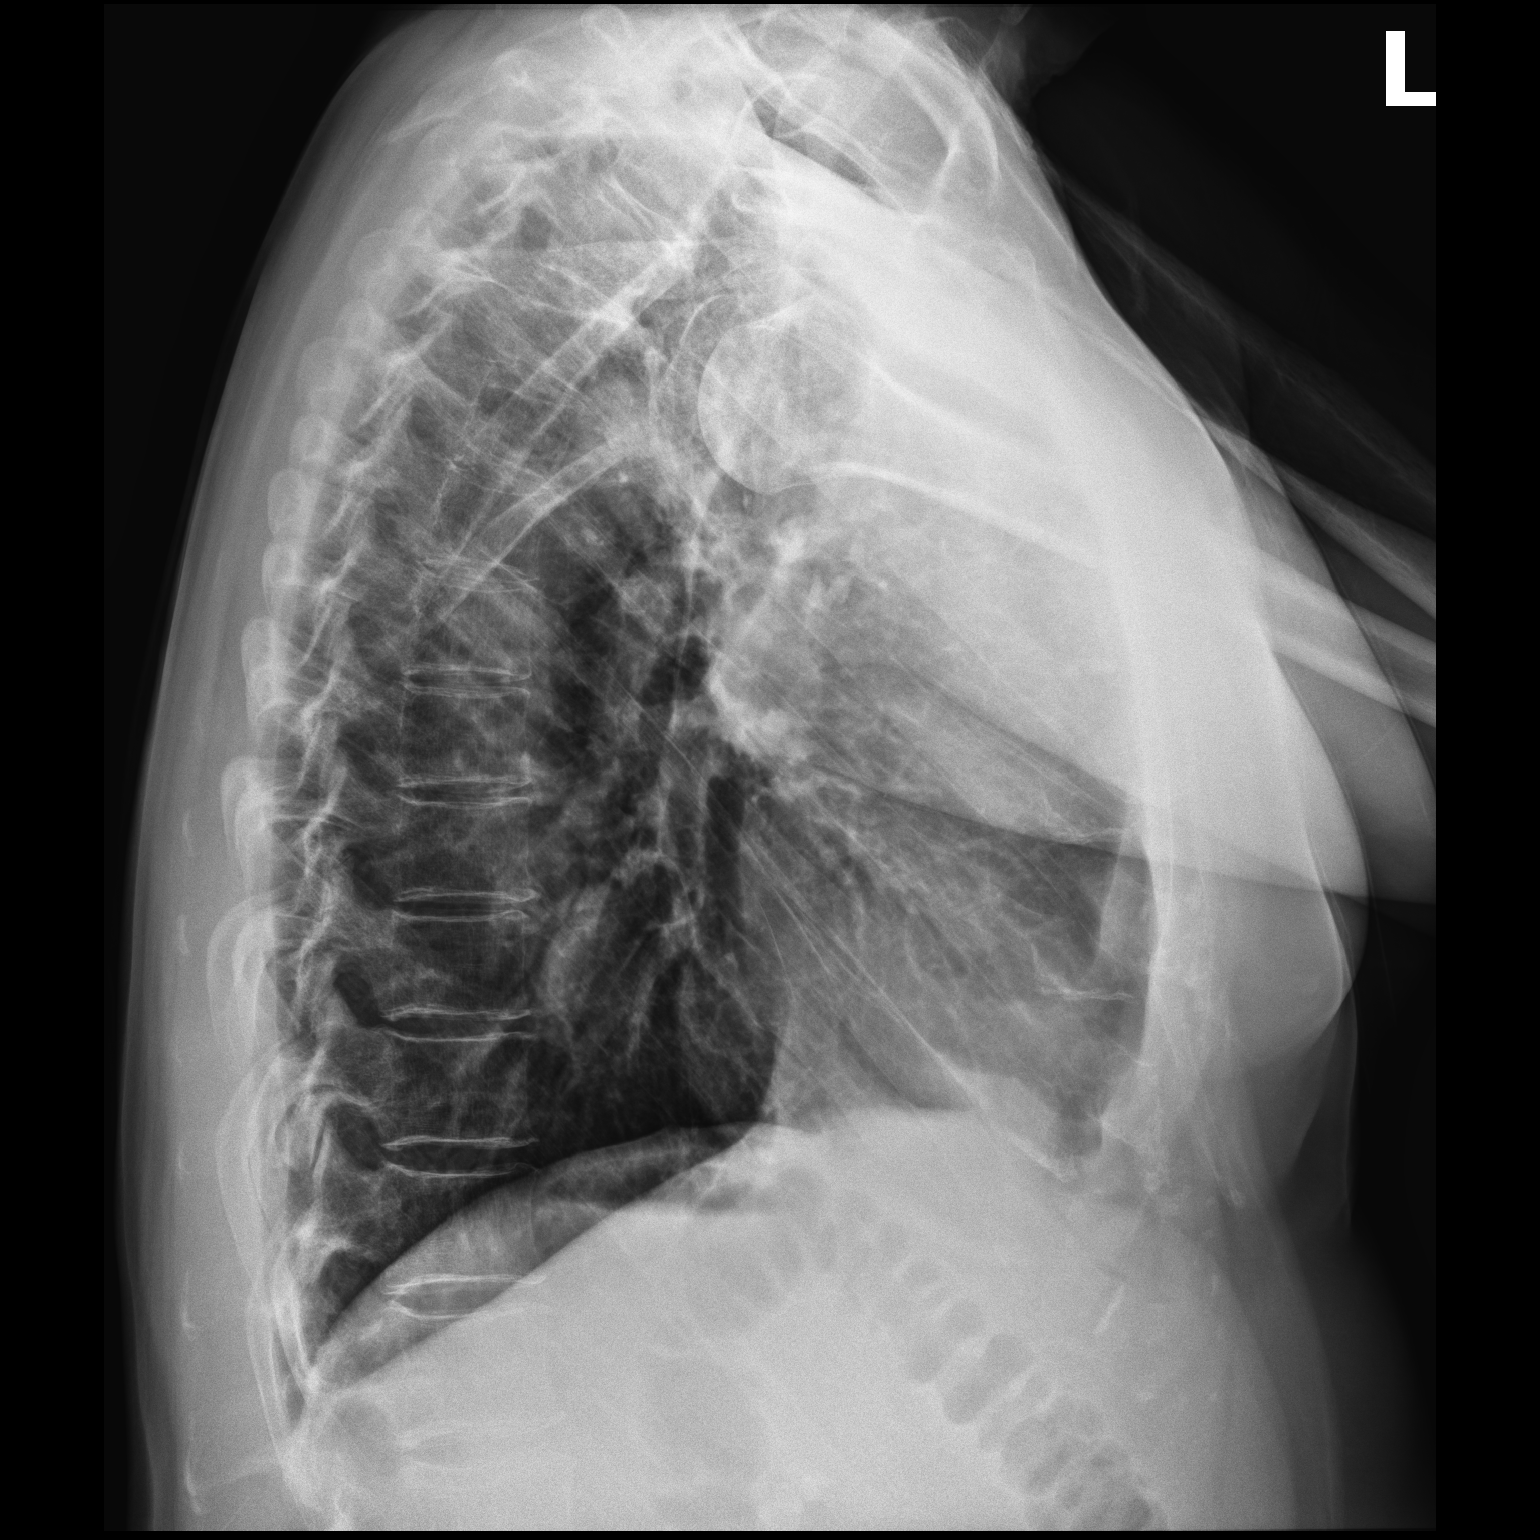

[2 of 2 positions shown; findings below may reference images not displayed]

FINDINGS: Heart: Normal

Lungs: Hyperlucent lungs are again noted consistent with COPD.  There are calcified granulomas.
There is no mass or airspace infiltrate.

Mediastinum: Normal

Pleural effusions: None

Bones: Normal

Visualized upper abdomen: Normal
IMPRESSION: No acute changes.

## 2021-03-19 ENCOUNTER — Encounter: Admit: 2021-03-19 | Discharge: 2021-03-19 | Payer: Medicare Other

## 2021-03-19 MED ORDER — FUROSEMIDE 40 MG PO TAB
ORAL_TABLET | Freq: Every day | 1 refills
Start: 2021-03-19 — End: ?

## 2021-03-23 ENCOUNTER — Encounter: Admit: 2021-03-23 | Discharge: 2021-03-23 | Payer: Medicare Other

## 2021-03-23 MED ORDER — JARDIANCE 10 MG PO TAB
ORAL_TABLET | Freq: Every day | 0 refills
Start: 2021-03-23 — End: ?

## 2021-03-28 ENCOUNTER — Encounter: Admit: 2021-03-28 | Discharge: 2021-03-28 | Payer: Medicare Other

## 2021-03-28 NOTE — Telephone Encounter
Pt is looking for a more local PCP whom he may be able to get in to see easier. He notes he just cannot get here to see PCP.     Gave him contact to [facility] on [address], as he goes to his specialists there.. he was appreciative and will call when he's established so we can take him off Dr Hessie Dibble patient list.

## 2021-04-19 ENCOUNTER — Encounter: Admit: 2021-04-19 | Discharge: 2021-04-19 | Payer: Medicare Other

## 2021-04-19 MED ORDER — WIXELA INHUB 250-50 MCG/DOSE IN DSDV
Freq: Two times a day (BID) | 1 refills
Start: 2021-04-19 — End: ?

## 2021-05-01 ENCOUNTER — Encounter: Admit: 2021-05-01 | Discharge: 2021-05-01 | Payer: Medicare Other

## 2021-05-01 MED ORDER — ALBUTEROL SULFATE 90 MCG/ACTUATION IN HFAA
Freq: Four times a day (QID) | 0 refills | PRN
Start: 2021-05-01 — End: ?

## 2021-05-01 MED ORDER — SULFAMETHOXAZOLE-TRIMETHOPRIM 800-160 MG PO TAB
ORAL_TABLET | 1 refills
Start: 2021-05-01 — End: ?

## 2021-05-01 NOTE — Telephone Encounter
Some Protocol elements NOT Met  Medication name: Sulfamethoxazole - TMP DS  Medication Strength: 160/800 mg tablet      Off protocol   Medication not assigned to a protocol, review manually.    Routed to Provider    Patient was last seen on 09/11/20 and should be seen on or around no follow up appointments..         Some Protocol elements NOT Met  Medication name: Albuterol HFA (Proair) Inhaler  Medication Strength: 90 mcg/actuation HFA aerosol inhaler      Manual message review: Review for uncontrolled symptoms if patient with asthma request more than 1 inhaler every 3 months.    Routed to Provider    Patient was last seen on 09/11/20 and should be seen on or around no future appointments.Marland Kitchen

## 2021-07-05 ENCOUNTER — Encounter: Admit: 2021-07-05 | Discharge: 2021-07-05 | Payer: Medicare Other

## 2021-07-05 MED ORDER — WIXELA INHUB 250-50 MCG/DOSE IN DSDV
Freq: Two times a day (BID) | 1 refills
Start: 2021-07-05 — End: ?

## 2021-07-07 ENCOUNTER — Encounter: Admit: 2021-07-07 | Discharge: 2021-07-07 | Payer: Medicare Other

## 2021-08-13 ENCOUNTER — Encounter: Admit: 2021-08-13 | Discharge: 2021-08-13 | Payer: Medicare Other

## 2021-08-13 MED ORDER — WIXELA INHUB 250-50 MCG/DOSE IN DSDV
Freq: Two times a day (BID) | 0 refills
Start: 2021-08-13 — End: ?

## 2021-08-17 ENCOUNTER — Encounter: Admit: 2021-08-17 | Discharge: 2021-08-17 | Payer: Medicare Other

## 2021-08-17 MED ORDER — JARDIANCE 10 MG PO TAB
ORAL_TABLET | Freq: Every day | 0 refills
Start: 2021-08-17 — End: ?

## 2021-08-17 NOTE — Telephone Encounter
Some Protocol elements NOT Met:  Valid encounter within last 6 months   Cr in normal range and within 360 days   HBA1C is 7.9 or below and within 360 days   eGFR in normal range and within 360 days   Medication name: Jardiance  Medication Strength: 10 mg    Office visit due and Labs due    Routed to Provider because this patient doesn't have an assigned PCP     Last fill date: 03/26/21 Quantity #: 90 Tablets R- 0

## 2021-08-22 ENCOUNTER — Encounter: Admit: 2021-08-22 | Discharge: 2021-08-22 | Payer: Medicare Other

## 2021-08-22 MED ORDER — JARDIANCE 10 MG PO TAB
ORAL_TABLET | Freq: Every day | 0 refills
Start: 2021-08-22 — End: ?

## 2021-08-22 MED ORDER — FUROSEMIDE 40 MG PO TAB
ORAL_TABLET | Freq: Every day | 1 refills
Start: 2021-08-22 — End: ?

## 2021-08-22 NOTE — Telephone Encounter
Some Protocol elements NOT Met:  Valid encounter within last 6 months   BP completed in the last 6 months   K in normal range and within 180 days   Na in normal range and within 180 days   Cr in normal range and within 180 days   eGFR in normal range and within 180 days   Medication name: Furosemide  Medication Strength: 40 mg    Manual message review, Office visit due and Labs due    Routed to Provider because the patient doesn't have an assigned PCP    Last fill date: 03/23/21 Quantity #: 30 Tablets R- 1

## 2021-08-22 NOTE — Telephone Encounter
Some Protocol elements NOT Met:  Valid encounter within last 6 months   Cr in normal range and within 360 days   HBA1C is 7.9 or below and within 360 days   eGFR in normal range and within 360 days   Medication name: Jardiance  Medication Strength: 10 mg    Office visit due and Labs due    Routed to Provider Because the patient doesn't have an assigned PCP    Last fill date: 03/26/21 Quantity #: 90 Tablets R- 0

## 2021-09-17 ENCOUNTER — Encounter: Admit: 2021-09-17 | Discharge: 2021-09-17 | Payer: Medicare Other

## 2021-09-17 MED ORDER — WIXELA INHUB 250-50 MCG/DOSE IN DSDV
Freq: Two times a day (BID) | 0 refills
Start: 2021-09-17 — End: ?

## 2021-09-17 NOTE — Telephone Encounter
Some Protocol elements NOT Met:  Valid encounter within last 12 months  Medication name: Delfin Edis  Medication Strength: 250-50 mcg/dose Inhalation Disk    Office visit due    Routed to Provider because the patient doesn't have a PCP assigned    Last fill date: 08/15/21 Quantity #: 60 Each R- 0

## 2021-11-19 ENCOUNTER — Encounter: Admit: 2021-11-19 | Discharge: 2021-11-19 | Payer: Medicare Other

## 2021-11-19 MED ORDER — WIXELA INHUB 250-50 MCG/DOSE IN DSDV
0 refills
Start: 2021-11-19 — End: ?

## 2021-11-19 MED ORDER — JARDIANCE 10 MG PO TAB
ORAL_TABLET | 0 refills
Start: 2021-11-19 — End: ?

## 2022-01-03 ENCOUNTER — Encounter: Admit: 2022-01-03 | Discharge: 2022-01-03 | Payer: Medicare Other

## 2022-01-03 MED ORDER — WIXELA INHUB 250-50 MCG/DOSE IN DSDV
0 refills
Start: 2022-01-03 — End: ?

## 2022-01-03 NOTE — Telephone Encounter
Some Protocol elements NOT Met   Valid encounter within last 12 months  Medication name: WIXELA INHUB 250-50 mcg/dose inhalation disk  Office visit due  Resolution: no PCP on file, Provided refill: 0 and Routed to Provider

## 2024-06-03 ENCOUNTER — Encounter: Admit: 2024-06-03 | Discharge: 2024-06-03 | Payer: MEDICARE

## 2024-06-04 ENCOUNTER — Encounter: Admit: 2024-06-04 | Discharge: 2024-06-04 | Payer: MEDICARE
